# Patient Record
Sex: Female | Born: 1975
Health system: Southern US, Community
[De-identification: ages and names within clinical notes are randomized; demographics above are authoritative.]

## PROBLEM LIST (undated history)

## (undated) DIAGNOSIS — K2970 Gastritis, unspecified, without bleeding: Secondary | ICD-10-CM

## (undated) DIAGNOSIS — K259 Gastric ulcer, unspecified as acute or chronic, without hemorrhage or perforation: Secondary | ICD-10-CM

## (undated) DIAGNOSIS — K589 Irritable bowel syndrome without diarrhea: Secondary | ICD-10-CM

## (undated) DIAGNOSIS — L659 Nonscarring hair loss, unspecified: Secondary | ICD-10-CM

## (undated) DIAGNOSIS — K5790 Diverticulosis of intestine, part unspecified, without perforation or abscess without bleeding: Secondary | ICD-10-CM

## (undated) DIAGNOSIS — O039 Complete or unspecified spontaneous abortion without complication: Secondary | ICD-10-CM

## (undated) DIAGNOSIS — I10 Essential (primary) hypertension: Secondary | ICD-10-CM

## (undated) DIAGNOSIS — E559 Vitamin D deficiency, unspecified: Secondary | ICD-10-CM

## (undated) DIAGNOSIS — R51 Headache: Secondary | ICD-10-CM

## (undated) DIAGNOSIS — K635 Polyp of colon: Secondary | ICD-10-CM

## (undated) DIAGNOSIS — R519 Headache, unspecified: Secondary | ICD-10-CM

## (undated) HISTORY — DX: Headache: R51

## (undated) HISTORY — DX: Polyp of colon: K63.5

## (undated) HISTORY — PX: COLONOSCOPY: SHX174

## (undated) HISTORY — DX: Gastric ulcer, unspecified as acute or chronic, without hemorrhage or perforation: K25.9

## (undated) HISTORY — DX: Nonscarring hair loss, unspecified: L65.9

## (undated) HISTORY — DX: Diverticulosis of intestine, part unspecified, without perforation or abscess without bleeding: K57.90

## (undated) HISTORY — PX: UPPER GASTROINTESTINAL ENDOSCOPY: SHX188

## (undated) HISTORY — DX: Complete or unspecified spontaneous abortion without complication: O03.9

## (undated) HISTORY — DX: Vitamin D deficiency, unspecified: E55.9

## (undated) HISTORY — DX: Headache, unspecified: R51.9

## (undated) HISTORY — DX: Irritable bowel syndrome, unspecified: K58.9

## (undated) HISTORY — DX: Gastritis, unspecified, without bleeding: K29.70

---

## 2011-01-17 ENCOUNTER — Emergency Department: Payer: Self-pay | Admitting: Emergency Medicine

## 2011-01-17 IMAGING — US US OB < 14 WEEKS - US OB TV
1 series · 17 of 28 positions shown · non-contrast
Comparison: none

REASON FOR EXAM: vaginal bleeding, HCG 62k, eval for IUP
COMMENTS:

[Series 1: us ob < 14 weeks - us ob tv · 17 of 53 slices shown]
[im 1/53]
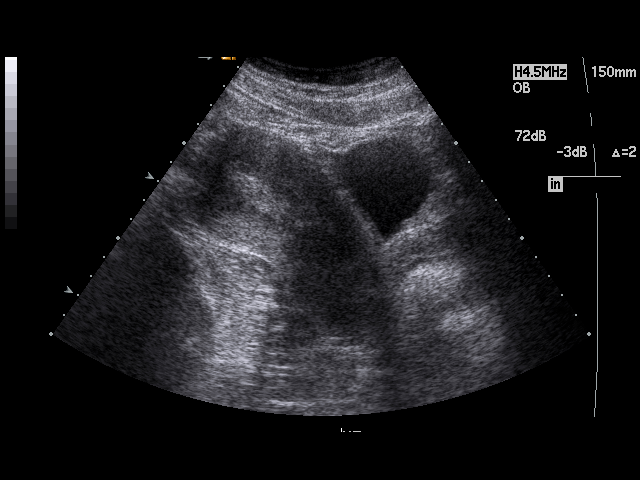
[im 4/53]
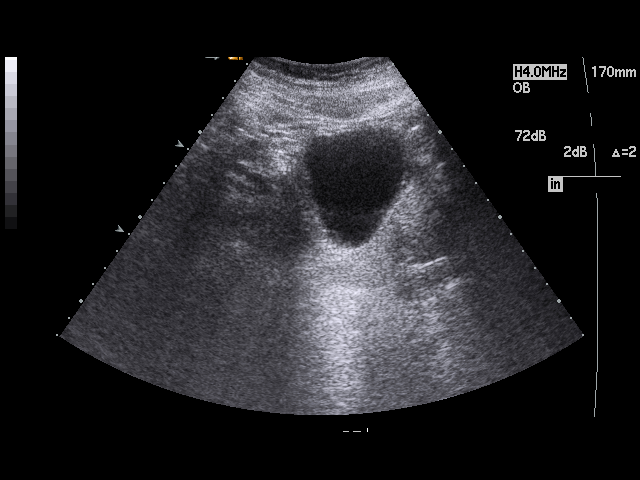
[im 8/53]
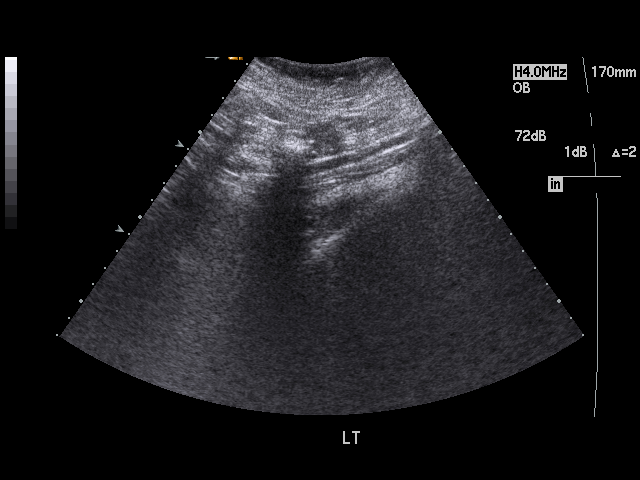
[im 10/53]
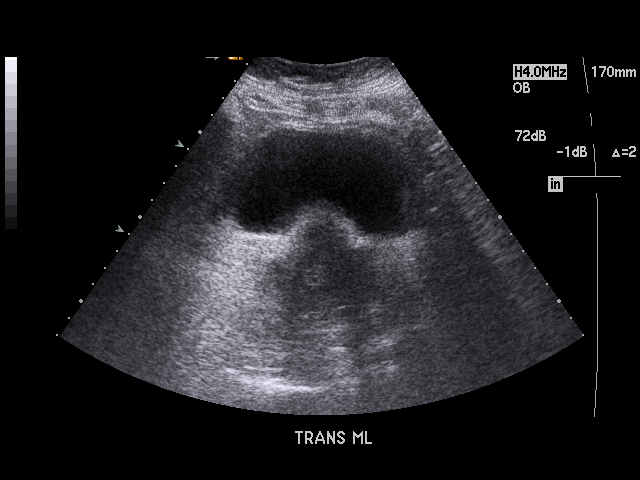
[im 14/53]
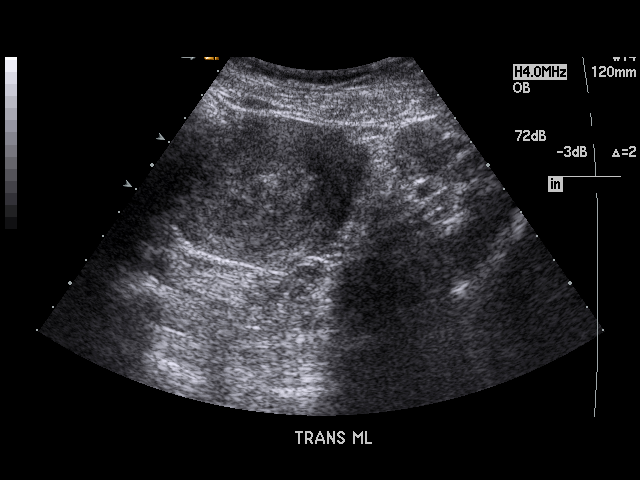
[im 18/53]
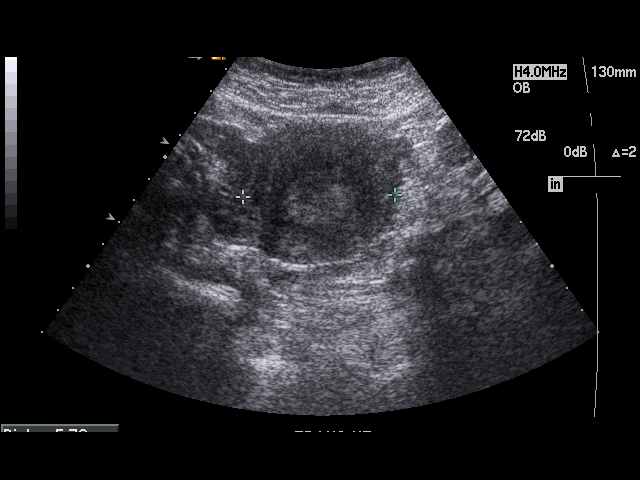
[im 20/53]
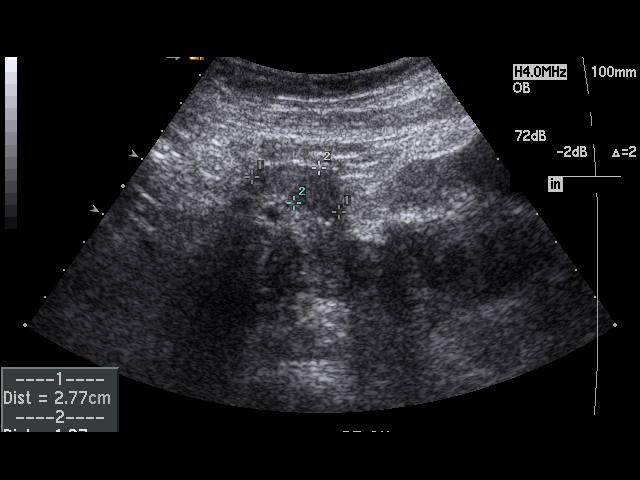
[im 24/53]
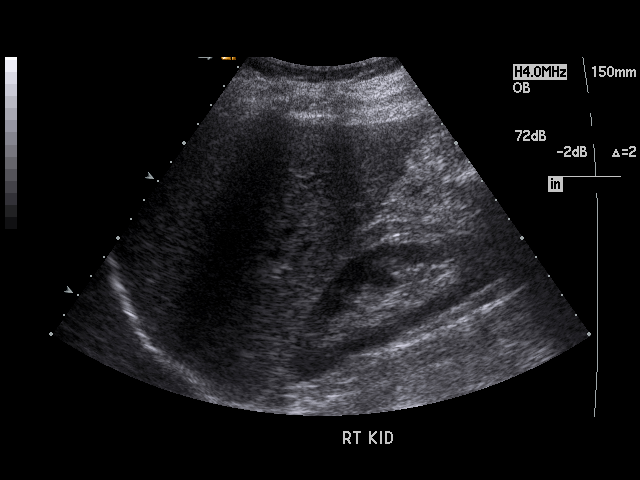
[im 27/53]
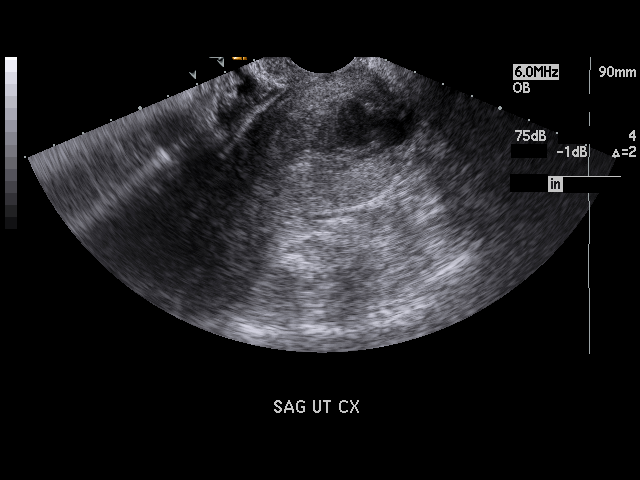
[im 29/53]
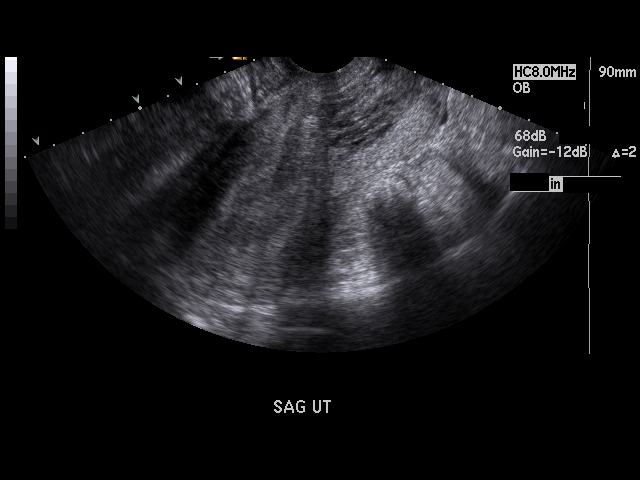
[im 33/53]
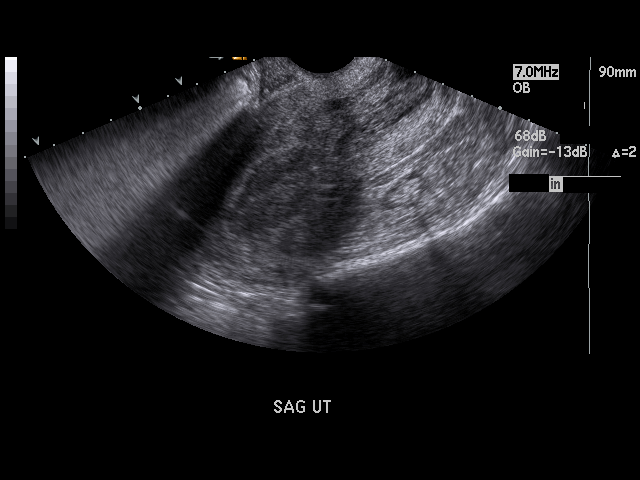
[im 35/53]
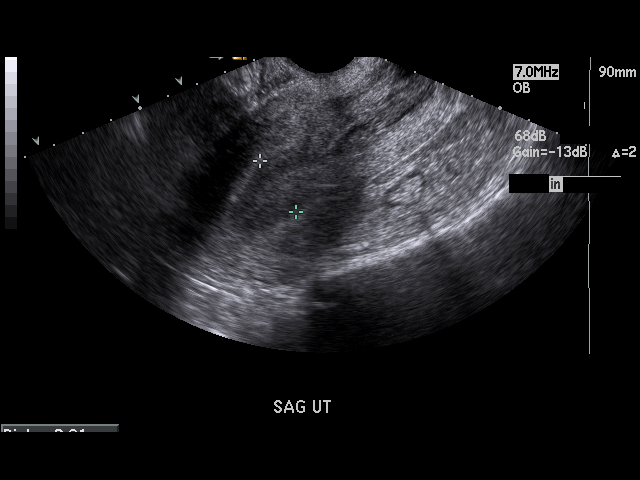
[im 39/53]
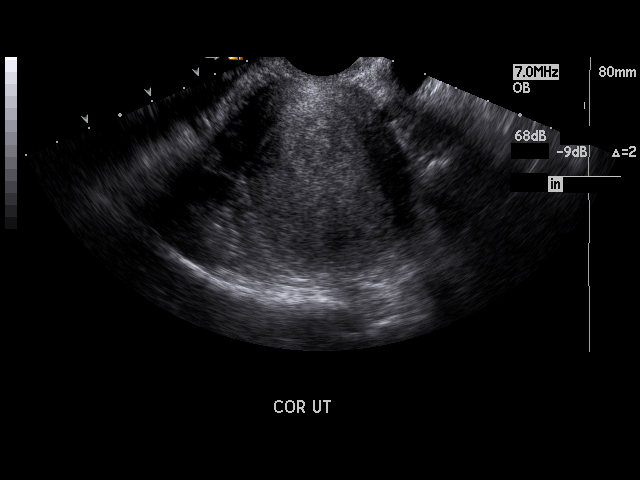
[im 43/53]
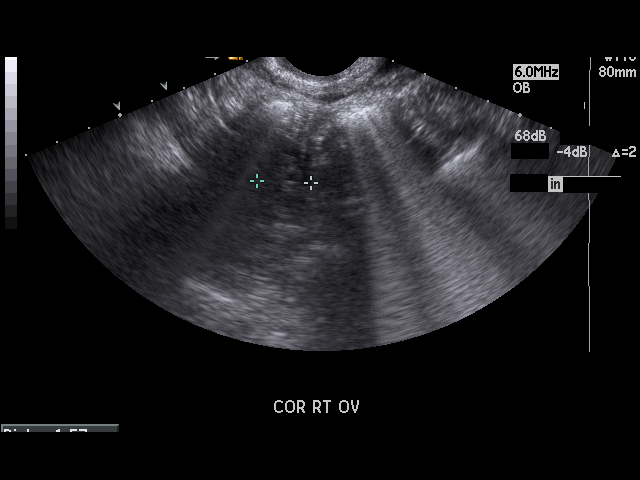
[im 45/53]
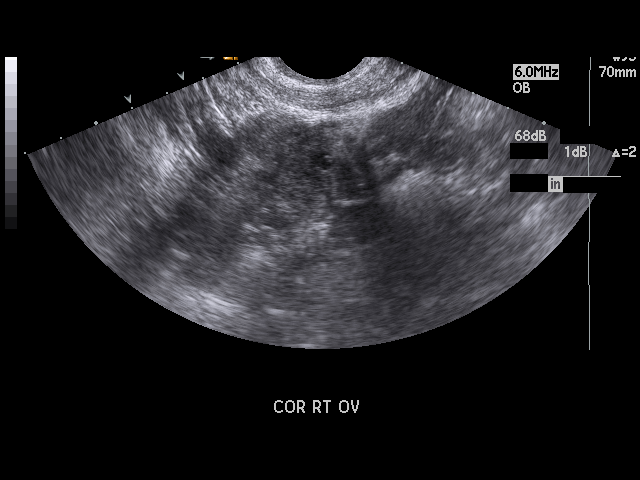
[im 49/53]
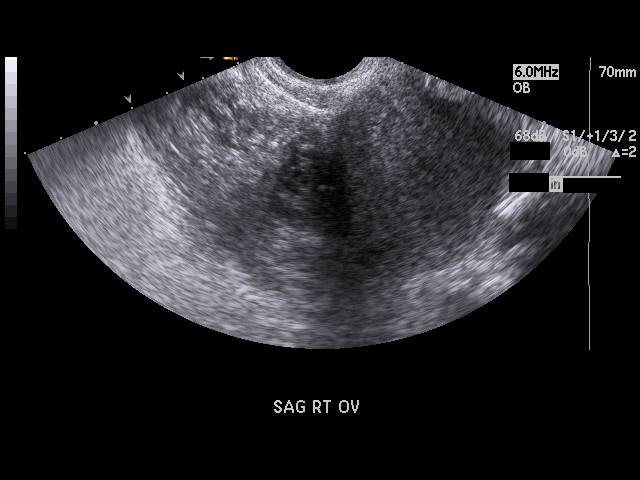
[im 53/53]
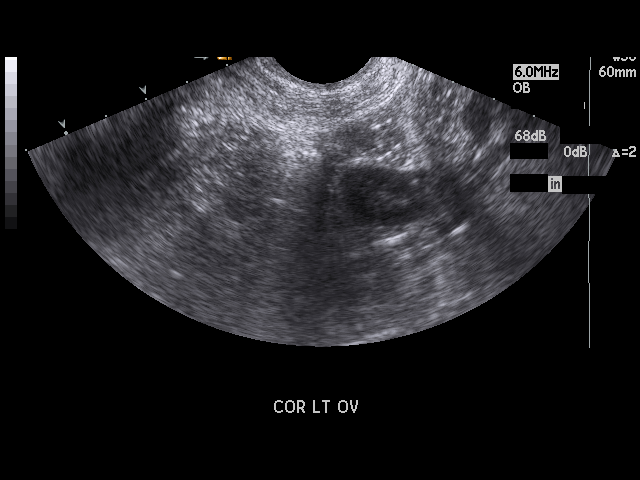

[17 of 28 positions shown; findings below may reference images not displayed]

PROCEDURE:     US  - US OB LESS THAN 14 WEEKS/W TRANS  - [DATE]  [DATE]

RESULT:     Comparison: None.

Technique and Findings:
Multiple grayscale images were obtained of the pelvis via transabdominal and
endovaginal ultrasound.

The endometrial stripe is mildly thickened, measuring 20 mm. No gestational
sac identified. Some heterogeneous echogenicity is seen near the cervical
canal.

The right ovary measures 2.8 x 1.6 x 1.6 cm. The left ovary measures 2.3 x
2.1 x 1.3 cm. The ovaries are normal in grayscale appearance. Spectral or
color Doppler images were not performed of the ovaries. No adnexal mass
identified.
IMPRESSION: No gestational sac identified. Given the positive beta HCG, differential
includes early intrauterine pregnancy, failed intrauterine pregnancy, and
ectopic pregnancy.  Recommend close clinical followup, serial quantitative
beta HCGs, and followup ultrasound as clinically indicated.

## 2012-08-25 ENCOUNTER — Emergency Department: Payer: Self-pay | Admitting: Unknown Physician Specialty

## 2012-08-25 LAB — CBC
HCT: 32.9 % — ABNORMAL LOW (ref 35.0–47.0)
MCH: 31.4 pg (ref 26.0–34.0)
MCHC: 33 g/dL (ref 32.0–36.0)
MCV: 95 fL (ref 80–100)
Platelet: 347 10*3/uL (ref 150–440)
RBC: 3.45 10*6/uL — ABNORMAL LOW (ref 3.80–5.20)
RDW: 13 % (ref 11.5–14.5)
WBC: 18.6 10*3/uL — ABNORMAL HIGH (ref 3.6–11.0)

## 2012-08-25 LAB — URINALYSIS, COMPLETE
Glucose,UR: NEGATIVE mg/dL (ref 0–75)
Leukocyte Esterase: NEGATIVE
Nitrite: NEGATIVE
Specific Gravity: 1.026 (ref 1.003–1.030)
Squamous Epithelial: 1
WBC UR: 14 /HPF (ref 0–5)

## 2012-08-25 LAB — COMPREHENSIVE METABOLIC PANEL
Anion Gap: 7 (ref 7–16)
BUN: 10 mg/dL (ref 7–18)
Bilirubin,Total: 0.4 mg/dL (ref 0.2–1.0)
Calcium, Total: 9.1 mg/dL (ref 8.5–10.1)
Chloride: 105 mmol/L (ref 98–107)
Co2: 24 mmol/L (ref 21–32)
EGFR (African American): >60
EGFR (Non-African Amer.): >60
Glucose: 132 mg/dL — ABNORMAL HIGH (ref 65–99)
Potassium: 3.4 mmol/L — ABNORMAL LOW (ref 3.5–5.1)
SGOT(AST): 20 U/L (ref 15–37)
SGPT (ALT): 21 U/L (ref 12–78)
Total Protein: 7.1 g/dL (ref 6.4–8.2)

## 2012-08-25 LAB — HCG, QUANTITATIVE, PREGNANCY: Beta Hcg, Quant.: 991 m[IU]/mL — ABNORMAL HIGH

## 2012-08-25 IMAGING — US US OB < 14 WEEKS - US OB TV
1 series · 14 of 28 positions shown · non-contrast
Comparison: none

REASON FOR EXAM: Abdominal pain, vaginal bleeding
COMMENTS:   May transport without cardiac monitor

[Series 1: us ob < 14 weeks - us ob tv · 0.25mm/px · 14 of 106 slices shown]
[im 4/106]
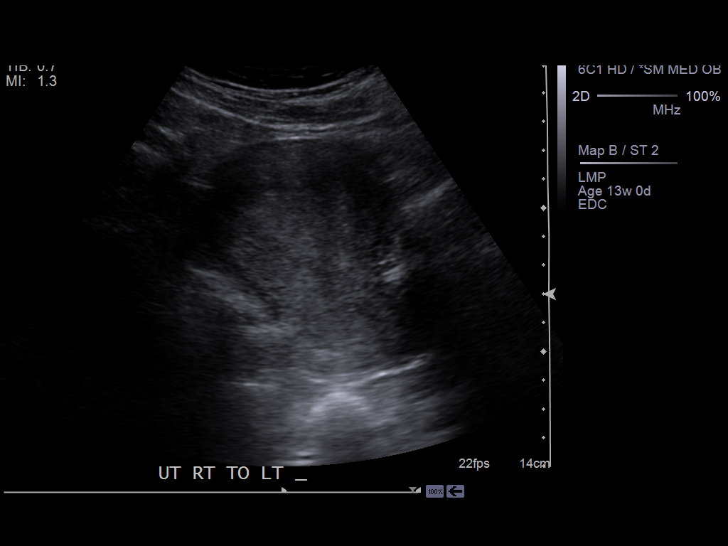
[im 12/106]
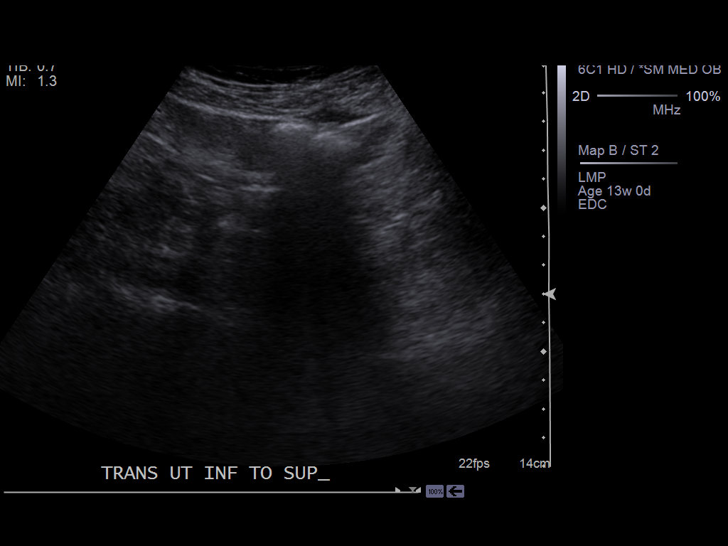
[im 20/106]
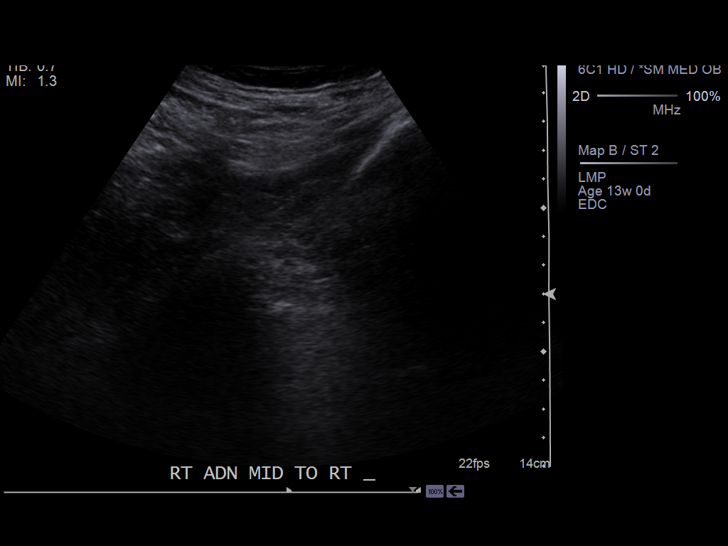
[im 28/106]
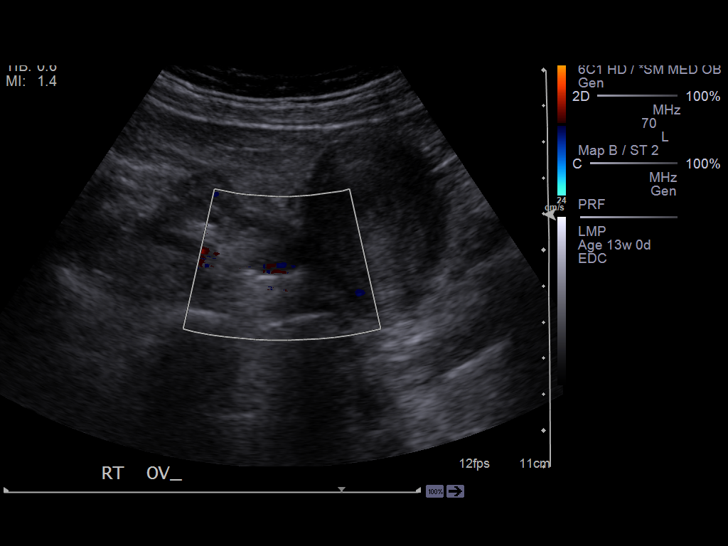
[im 36/106]
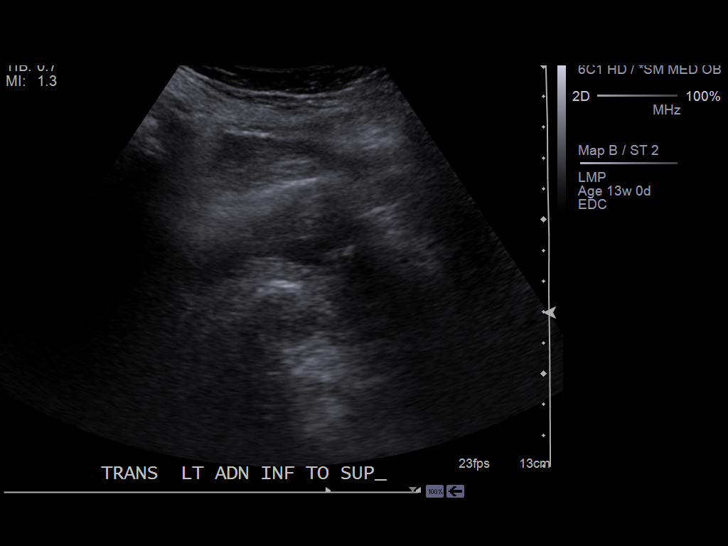
[im 43/106]
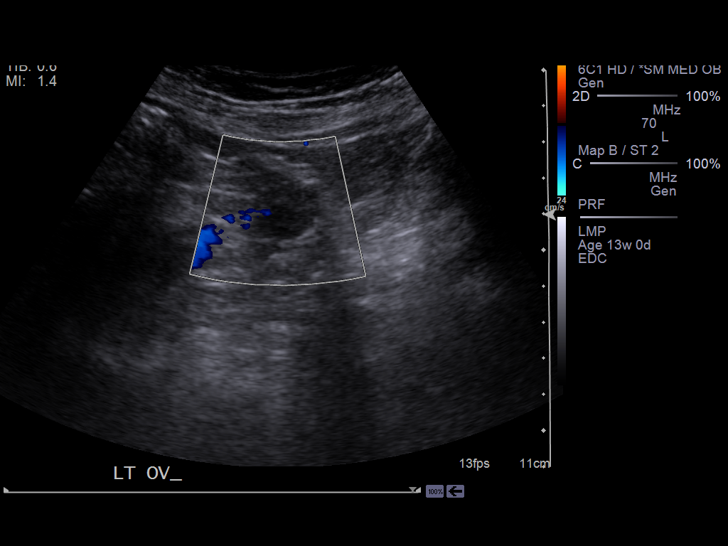
[im 51/106]
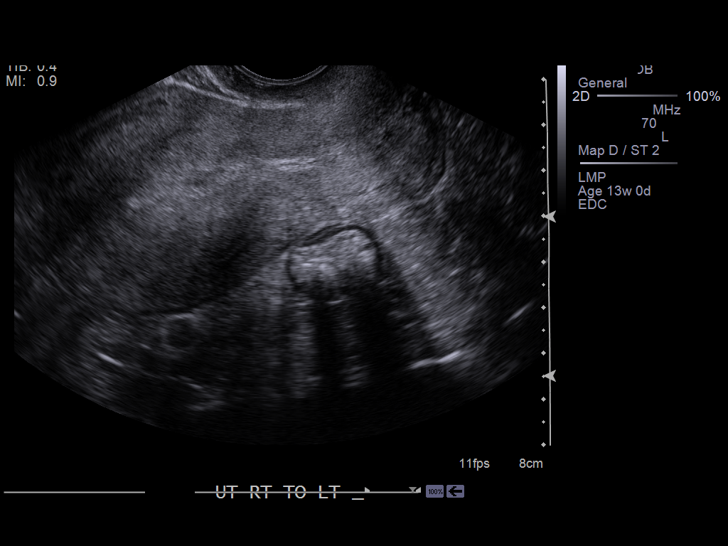
[im 59/106]
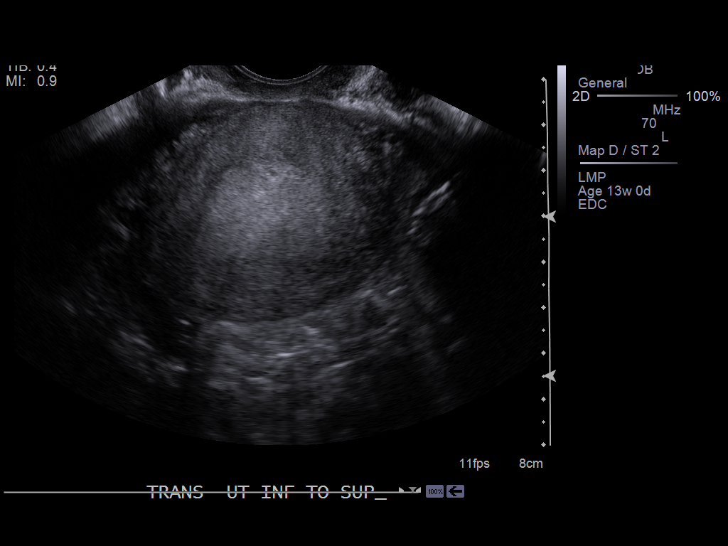
[im 67/106]
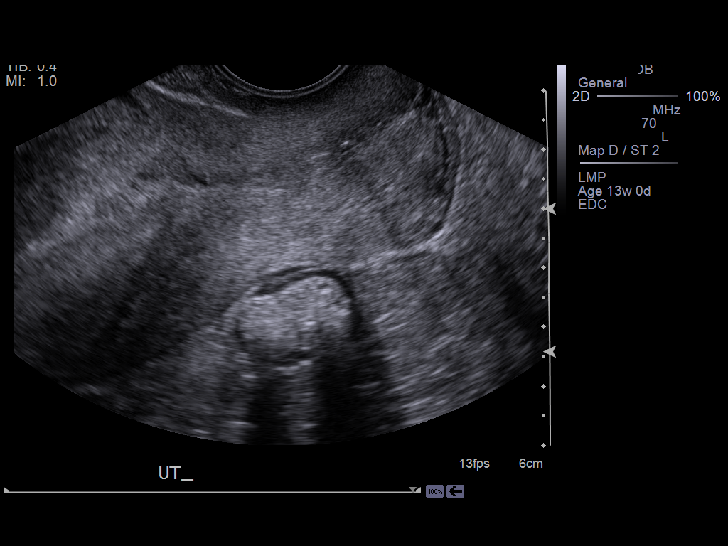
[im 74/106]
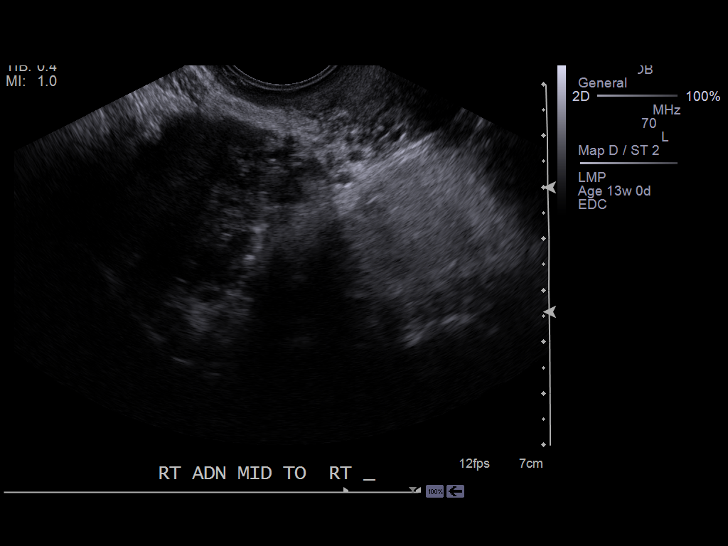
[im 82/106]
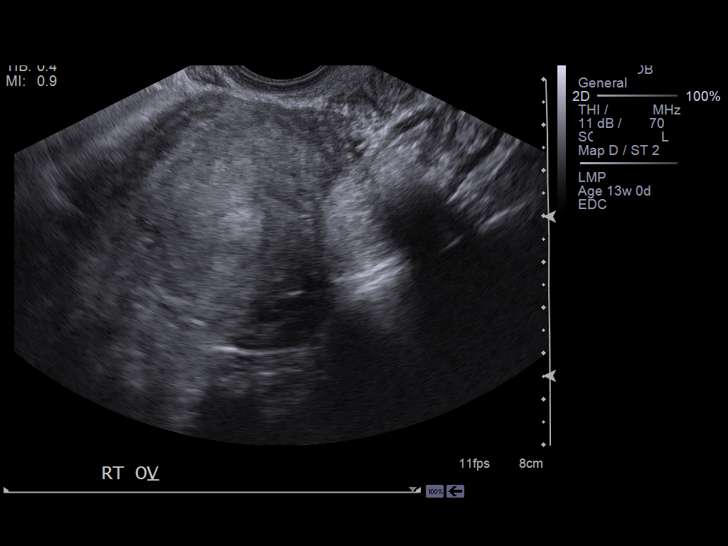
[im 90/106]
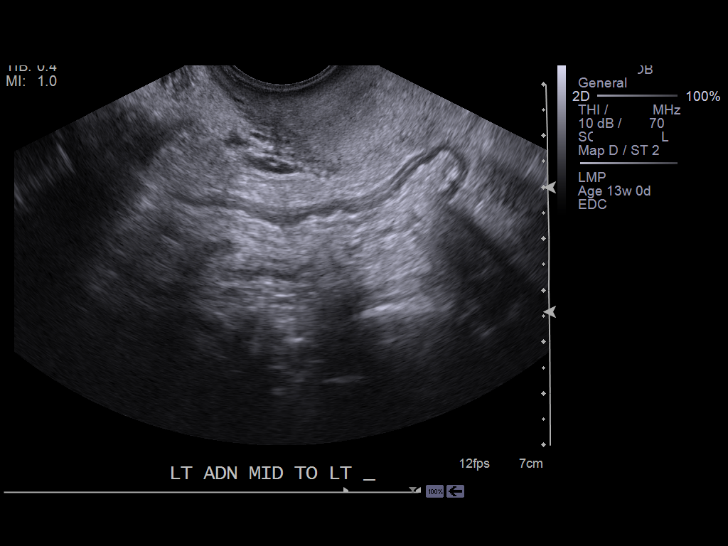
[im 98/106]
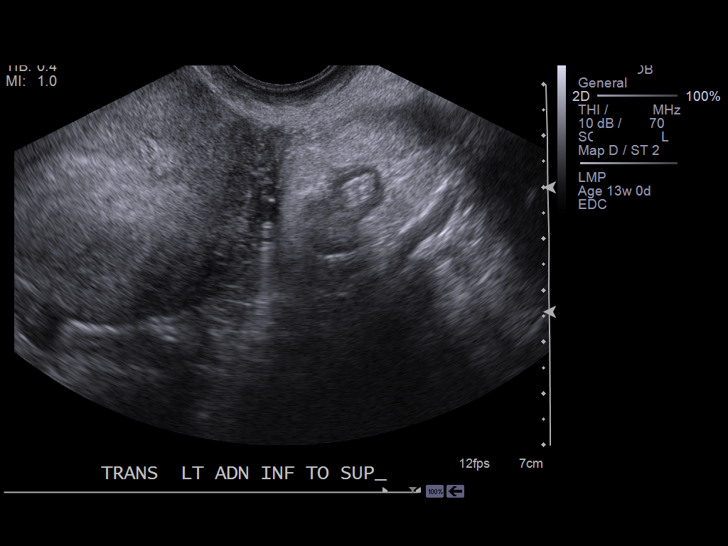
[im 106/106]
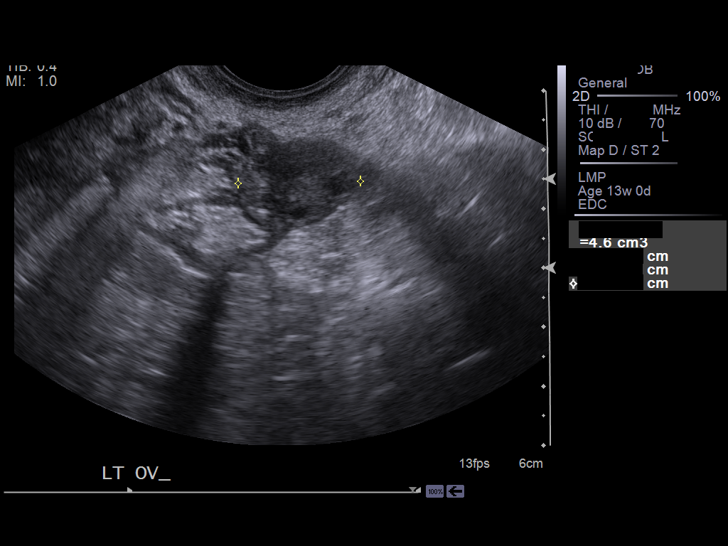

[14 of 28 positions shown; findings below may reference images not displayed]

PROCEDURE:     US  - US OB LESS THAN 14 WEEKS/W TRANS  - [DATE] [DATE]

RESULT:     The uterus appears empty. It measures 9.1 x 5.7 by 6.2 cm. The
endometrial stripe is thickened at 14.3 mm. No gestational sac or yolk sac
or fetal pole is demonstrated.

The maternal ovaries are normal in appearance with the right ovary measuring
2.6 x 1.3 x 1.3 cm. The left ovary measures 2.7 x 1.6 x 2.1 cm.
IMPRESSION: 1. The findings likely reflect recent miscarriage. There is no evidence of
an IUP nor significant retained products of conception. The endometrial
stripe is thickened however.
2. No adnexal abnormalities are demonstrated. There is no free fluid in the
pelvis.

Serial followup beta-hCG determinations as well as followup pelvic
ultrasound exams would be useful.

[REDACTED]

## 2012-09-06 HISTORY — PX: OTHER SURGICAL HISTORY: SHX169

## 2013-05-22 ENCOUNTER — Ambulatory Visit: Payer: Self-pay

## 2014-02-04 ENCOUNTER — Encounter (INDEPENDENT_AMBULATORY_CARE_PROVIDER_SITE_OTHER): Payer: Self-pay

## 2014-02-04 ENCOUNTER — Encounter: Payer: Self-pay | Admitting: Internal Medicine

## 2014-02-04 ENCOUNTER — Ambulatory Visit (INDEPENDENT_AMBULATORY_CARE_PROVIDER_SITE_OTHER): Payer: BC Managed Care – PPO | Admitting: Internal Medicine

## 2014-02-04 VITALS — BP 138/84 | HR 107 | Temp 98.3°F | Ht 60.0 in | Wt 128.5 lb

## 2014-02-04 DIAGNOSIS — F439 Reaction to severe stress, unspecified: Secondary | ICD-10-CM

## 2014-02-04 DIAGNOSIS — O039 Complete or unspecified spontaneous abortion without complication: Secondary | ICD-10-CM

## 2014-02-04 DIAGNOSIS — E559 Vitamin D deficiency, unspecified: Secondary | ICD-10-CM

## 2014-02-04 DIAGNOSIS — R51 Headache: Secondary | ICD-10-CM

## 2014-02-04 DIAGNOSIS — L659 Nonscarring hair loss, unspecified: Secondary | ICD-10-CM

## 2014-02-04 DIAGNOSIS — R519 Headache, unspecified: Secondary | ICD-10-CM

## 2014-02-04 DIAGNOSIS — Z733 Stress, not elsewhere classified: Secondary | ICD-10-CM

## 2014-02-04 NOTE — Progress Notes (Signed)
Pre visit review using our clinic review tool, if applicable. No additional management support is needed unless otherwise documented below in the visit note. 

## 2014-02-05 ENCOUNTER — Encounter: Payer: Self-pay | Admitting: Internal Medicine

## 2014-02-05 DIAGNOSIS — R519 Headache, unspecified: Secondary | ICD-10-CM | POA: Insufficient documentation

## 2014-02-05 DIAGNOSIS — O039 Complete or unspecified spontaneous abortion without complication: Secondary | ICD-10-CM | POA: Insufficient documentation

## 2014-02-05 DIAGNOSIS — L659 Nonscarring hair loss, unspecified: Secondary | ICD-10-CM | POA: Insufficient documentation

## 2014-02-05 DIAGNOSIS — F439 Reaction to severe stress, unspecified: Secondary | ICD-10-CM | POA: Insufficient documentation

## 2014-02-05 DIAGNOSIS — R51 Headache: Secondary | ICD-10-CM

## 2014-02-05 DIAGNOSIS — E559 Vitamin D deficiency, unspecified: Secondary | ICD-10-CM | POA: Insufficient documentation

## 2014-02-05 NOTE — Assessment & Plan Note (Signed)
Worsening pain.  Unclear etiology.  Question of more nerve related.  Does not appear to be an actual "headache".  No rash.  Does have some localized point tenderness, but she also describes a pressure/squeezing sensation.  No vision change.  No other neurological changes.  Will have neurology evaluate to help with diagnosis.  Question if would benefit form a trial of gabapentin.  She was in agreement with referral.

## 2014-02-05 NOTE — Assessment & Plan Note (Signed)
On vitamin D supplements.  Follow.   

## 2014-02-05 NOTE — Assessment & Plan Note (Signed)
Feels she is handling things relatively well.  No increased stress.  Follow.

## 2014-02-05 NOTE — Assessment & Plan Note (Signed)
Persistent.  Seeing dermatology.  Reports has had recent normal hgb and tsh.  Is taking vitamin D supplements.  Unclear etiology.  Just started rogaine.  Obtain labs and records.

## 2014-02-05 NOTE — Progress Notes (Signed)
Subjective:    Patient ID: Michele Kim, female    DOB: Aug 16, 1976, 38 y.o.   MRN: [id number]  HPI 38 year old female with past history of a miscarriage (one year ago) and hair loss.  She comes in today to follow up on these issues as well as to establish care.  She has been followed at [facility].  Last pelvic and pap 5/14.  States has never had an abnormal pap smear.  She states her periods are regular.  Occur every 4-5 weeks.  Not using any form of birth control.  Not sure if she wants more children.  Has one 68 year old daughter.  States that over the last two months has developed significant hair loss.  She is seeing dermatology (in [place]).  She is currently on clindamycin, clobetasol and rogaine.  Just started the rogaine two weeks ago.  States her paternal grandmother and paternal aunt had hair loss later in life.  Some increased stress, but states this is nothing out of the ordinary.  She had recent labs through dermatology.  Vitamin D low.  Being replaced.  Hgb and thyroid - wnl (per her report).  She does report a squeezing sensation/pain over the top of her head extending down the right posterior lateral side.  Pain started with the hair loss.  Has worsened recently.  No actual headache but does report the increased pressure/squeezing pain.  Some of the pain is reproducible in certain areas of her scalp.  No rash.  Became teary eyed discussing the increased pain.  Worse at times.  No vision change.  Eating and drinking well.  No nausea or vomiting.  No bowel change.     Past Medical History  Diagnosis Date  . Frequent headaches     H/O  . Miscarriage     x 2  . Hair loss   . Vitamin D deficiency     Outpatient Encounter Prescriptions as of 02/04/2014  Medication Sig  . clindamycin (CLEOCIN T) 1 % external solution Apply 1 application topically daily.  . clobetasol (TEMOVATE) 0.05 % external solution Apply 1 application topically daily.  . ergocalciferol (VITAMIN  D2) 50000 UNITS capsule Take 50,000 Units by mouth once a week.    Review of Systems Reports the head pain as outlined.  No rash.  No significant lightheadedness or dizziness.  No increased sinus or allergy symptoms.  No chest pain, tightness or palpitations.  No increased shortness of breath, cough or congestion.  No acid reflux.  No nausea or vomiting.  No abdominal pain or cramping.  No bowel change, such as diarrhea, constipation, BRBPR or melana.  No urine change.  Miscarriage last year.  Periods regular.  Hair loss as outlined.       Objective:   Physical Exam Filed Vitals:   02/04/14 1346  BP: 138/84  Pulse: 107  Temp: 98.3 F (36.8 C)   Blood pressure recheck:  110-112/80, pulse 70  38 year old female in no acute distress.   HEENT:  Nares- clear.  Oropharynx - without lesions.  Scalp- no rash.  Increased pain to palpation over certain localized areas of the scalp.  Increased hair loss - more localized top center of head and extending posterior laterally.   NECK:  Supple.  Nontender.  No audible bruit.  HEART:  Appears to be regular. LUNGS:  No crackles or wheezing audible.  Respirations even and unlabored.  RADIAL PULSE:  Equal bilaterally.     ABDOMEN:  Soft, nontender.  Bowel sounds present and normal.  No audible abdominal bruit.   EXTREMITIES:  No increased edema present.  DP pulses palpable and equal bilaterally.          Assessment & Plan:  HEALTH MAINTENANCE.  Schedule her for a physical next visit.  Obtain records from [facility] and dermatolog. Schedule baseline mammogram.    I spent 45 minutes with the patient and more than 50% of the time was spent in consultation regarding the above.

## 2014-02-05 NOTE — Assessment & Plan Note (Signed)
Had a miscarriage one year ago.  Regular periods now.  Followed up with gyn after miscarriage.  Not sure if she wants more children.  Discussed birth control options.    

## 2014-04-30 ENCOUNTER — Encounter: Payer: Self-pay | Admitting: Internal Medicine

## 2014-04-30 ENCOUNTER — Ambulatory Visit (INDEPENDENT_AMBULATORY_CARE_PROVIDER_SITE_OTHER): Payer: BC Managed Care – PPO | Admitting: Internal Medicine

## 2014-04-30 VITALS — BP 112/76 | HR 105 | Temp 98.2°F | Resp 12 | Ht 60.0 in | Wt 120.5 lb

## 2014-04-30 DIAGNOSIS — L659 Nonscarring hair loss, unspecified: Secondary | ICD-10-CM

## 2014-04-30 DIAGNOSIS — Z733 Stress, not elsewhere classified: Secondary | ICD-10-CM

## 2014-04-30 DIAGNOSIS — R51 Headache: Secondary | ICD-10-CM

## 2014-04-30 DIAGNOSIS — Z1239 Encounter for other screening for malignant neoplasm of breast: Secondary | ICD-10-CM

## 2014-04-30 DIAGNOSIS — F439 Reaction to severe stress, unspecified: Secondary | ICD-10-CM

## 2014-04-30 DIAGNOSIS — E559 Vitamin D deficiency, unspecified: Secondary | ICD-10-CM

## 2014-04-30 DIAGNOSIS — O039 Complete or unspecified spontaneous abortion without complication: Secondary | ICD-10-CM

## 2014-04-30 DIAGNOSIS — R519 Headache, unspecified: Secondary | ICD-10-CM

## 2014-04-30 LAB — COMPREHENSIVE METABOLIC PANEL
ALT: 16 U/L (ref 0–35)
AST: 19 U/L (ref 0–37)
Albumin: 4.2 g/dL (ref 3.5–5.2)
Alkaline Phosphatase: 54 U/L (ref 39–117)
BILIRUBIN TOTAL: 0.4 mg/dL (ref 0.2–1.2)
BUN: 14 mg/dL (ref 6–23)
CHLORIDE: 104 meq/L (ref 96–112)
CO2: 24 meq/L (ref 19–32)
CREATININE: 0.7 mg/dL (ref 0.4–1.2)
Calcium: 9.4 mg/dL (ref 8.4–10.5)
GFR: 94.29 mL/min (ref >60.00)
GLUCOSE: 82 mg/dL (ref 70–99)
Potassium: 4.7 mEq/L (ref 3.5–5.1)
SODIUM: 138 meq/L (ref 135–145)
Total Protein: 6.9 g/dL (ref 6.0–8.3)

## 2014-04-30 LAB — TSH: TSH: 0.58 u[IU]/mL (ref 0.35–4.50)

## 2014-04-30 LAB — CBC WITH DIFFERENTIAL/PLATELET
Basophils Absolute: 0.1 10*3/uL (ref 0.0–0.1)
Basophils Relative: 1.5 % (ref 0.0–3.0)
Eosinophils Absolute: 0.8 10*3/uL — ABNORMAL HIGH (ref 0.0–0.7)
Eosinophils Relative: 9.7 % — ABNORMAL HIGH (ref 0.0–5.0)
HEMATOCRIT: 42.9 % (ref 36.0–46.0)
Hemoglobin: 14.3 g/dL (ref 12.0–15.0)
LYMPHS ABS: 2.3 10*3/uL (ref 0.7–4.0)
LYMPHS PCT: 29.7 % (ref 12.0–46.0)
MCHC: 33.3 g/dL (ref 30.0–36.0)
MCV: 95.9 fl (ref 78.0–100.0)
MONOS PCT: 8.1 % (ref 3.0–12.0)
Monocytes Absolute: 0.6 10*3/uL (ref 0.1–1.0)
Neutro Abs: 4 10*3/uL (ref 1.4–7.7)
Neutrophils Relative %: 51 % (ref 43.0–77.0)
Platelets: 275 10*3/uL (ref 150.0–400.0)
RBC: 4.47 Mil/uL (ref 3.87–5.11)
RDW: 13.4 % (ref 11.5–15.5)
WBC: 7.9 10*3/uL (ref 4.0–10.5)

## 2014-04-30 NOTE — Progress Notes (Signed)
Pre visit review using our clinic review tool, if applicable. No additional management support is needed unless otherwise documented below in the visit note. 

## 2014-05-01 ENCOUNTER — Encounter: Payer: Self-pay | Admitting: *Deleted

## 2014-05-02 ENCOUNTER — Encounter: Payer: Self-pay | Admitting: Internal Medicine

## 2014-05-02 NOTE — Progress Notes (Signed)
Subjective:    Patient ID: Michele Kim, female    DOB: 30-May-1976, 38 y.o.   MRN: [id number]  HPI 38 year old female with past history of a miscarriage (one year ago) and hair loss.  She comes in today to follow up on these issues as well as for a complete physical exam.   She has previously been followed at [facility].  Last pelvic and pap 5/14.  States has never had an abnormal pap smear.  She states her periods are regular.  Occur every 4-5 weeks.  Not using any form of birth control.  Not sure if she wants more children.  Has one 62 year old daughter.  We discussed this again today.  States she is "pretty sure she does not want more children".  Having her period today.  Over the last few months has developed significant hair loss.  She is seeing dermatology (in [place]).  She has tried multiple medications.  States her paternal grandmother and paternal aunt had hair loss later in life.  Some increased stress, but states this is nothing out of the ordinary.  She had recent labs through dermatology.  Vitamin D low.  Being replaced.  Hgb and thyroid - wnl (per her report).  She is planning to see Dr Evorn Gong in 10/15.  She does report a squeezing sensation/pain over the top of her head extending down the right posterior lateral side.  Pain started with the hair loss.  Has worsened recently.  Describes it as a pressure sensation - like something squeezing her head.  No vision change.  No increase in pain with bending forward or coughing.   No rash.  Became teary eyed discussing the increased pain.  Worse at times.   Eating and drinking well.  No nausea or vomiting, except for occasional nausea with increased pain.  No bowel change.     Past Medical History  Diagnosis Date  . Frequent headaches     H/O  . Miscarriage     x 2  . Hair loss   . Vitamin D deficiency     Outpatient Encounter Prescriptions as of 04/30/2014  Medication Sig  . clindamycin (CLEOCIN T) 1 % external  solution Apply 1 application topically daily.  . clobetasol (TEMOVATE) 0.05 % external solution Apply 1 application topically daily.  . ergocalciferol (VITAMIN D2) 50000 UNITS capsule Take 50,000 Units by mouth once a week.    Review of Systems Reports the head pain as outlined.  Described as a squeezing/pressure sensation.  No rash.  No significant lightheadedness or dizziness.  No increased sinus or allergy symptoms.  No vision change.  No chest pain, tightness or palpitations.  No increased shortness of breath, cough or congestion.  No acid reflux.  No vomiting.  Occasional nausea as outlined.  No abdominal pain or cramping.  No bowel change, such as diarrhea, constipation, BRBPR or melana.  No urine change.  Miscarriage last year.  Periods regular.  Hair loss as outlined.  Takes BCs.  This woks best for her.  Make take 2-3 per day.       Objective:   Physical Exam  Filed Vitals:   04/30/14 1133  BP: 112/76  Pulse: 105  Temp: 98.2 F (36.8 C)  Resp: 19   38 year old female in no acute distress.   HEENT:  Nares- clear.  Oropharynx - without lesions.  Scalp- no rash.   Increased hair loss - more localized top center of head  and extending posterior laterally.  Unable to visualize fundi fully.   NECK:  Supple.  Nontender.  No audible bruit.  HEART:  Appears to be regular. LUNGS:  No crackles or wheezing audible.  Respirations even and unlabored.  RADIAL PULSE:  Equal bilaterally.    BREASTS:  No nipple discharge or nipple retraction present.  Could not appreciate any distinct nodules or axillary adenopathy.  ABDOMEN:  Soft, nontender.  Bowel sounds present and normal.  No audible abdominal bruit.  GU:  Not performed.  Pt having her period.     EXTREMITIES:  No increased edema present.  DP pulses palpable and equal bilaterally.      Assessment & Plan:  HEALTH MAINTENANCE.  Physical today.  Unable to perform pelvic/pap.  Having her period.   Schedule baseline mammogram.    I spent 25  minutes with the patient and more than 50% of the time was spent in consultation regarding the above.

## 2014-05-02 NOTE — Assessment & Plan Note (Signed)
Persistent.  Seeing dermatology.  Reports has had recent normal hgb and tsh.  Is taking vitamin D supplements.  Unclear etiology.  Has tried rogaine.  She is scheduled to see Dr Evorn Gong in 10/15.  States she scheduled this appt to get another opinion.

## 2014-05-02 NOTE — Assessment & Plan Note (Signed)
Worsening pain.  Unclear etiology.   No rash.   No vision change.  No other neurological changes.  Will have neurology evaluate to help with diagnosis.  Question if would benefit form a trial of gabapentin.  She was in agreement with referral.  Discussed with neurology.  They will try to work her in soon.  Discussed MRI, especially given the increased pain and pressure/squeezing feeling.  She wants to check with her insurance before scheduling.  Discussed with her regarding decreasing the amount of BCs.  Discussed rebound headaches.  Start magnesium.  Refer to neurology.

## 2014-05-02 NOTE — Assessment & Plan Note (Signed)
On vitamin D supplements.  Follow.   

## 2014-05-02 NOTE — Assessment & Plan Note (Signed)
Feels she is handling things relatively well.  Follow.   

## 2014-05-02 NOTE — Assessment & Plan Note (Signed)
Had a miscarriage one year ago.  Regular periods now.  Followed up with gyn after miscarriage.  Not sure if she wants more children.  Discussed birth control options.

## 2014-06-13 ENCOUNTER — Encounter: Payer: BC Managed Care – PPO | Admitting: Internal Medicine

## 2014-07-19 ENCOUNTER — Other Ambulatory Visit (HOSPITAL_COMMUNITY)
Admission: RE | Admit: 2014-07-19 | Discharge: 2014-07-19 | Disposition: A | Discharge status: Home / Self Care | Payer: BC Managed Care – PPO | Source: Ambulatory Visit | Attending: Internal Medicine | Admitting: Internal Medicine

## 2014-07-19 ENCOUNTER — Ambulatory Visit (INDEPENDENT_AMBULATORY_CARE_PROVIDER_SITE_OTHER): Payer: BC Managed Care – PPO | Admitting: Internal Medicine

## 2014-07-19 ENCOUNTER — Encounter: Payer: Self-pay | Admitting: Internal Medicine

## 2014-07-19 ENCOUNTER — Ambulatory Visit (INDEPENDENT_AMBULATORY_CARE_PROVIDER_SITE_OTHER): Payer: BC Managed Care – PPO | Admitting: *Deleted

## 2014-07-19 VITALS — BP 101/67 | HR 96 | Temp 98.3°F | Ht 60.5 in | Wt 125.8 lb

## 2014-07-19 DIAGNOSIS — Z124 Encounter for screening for malignant neoplasm of cervix: Secondary | ICD-10-CM

## 2014-07-19 DIAGNOSIS — Z1151 Encounter for screening for human papillomavirus (HPV): Secondary | ICD-10-CM | POA: Insufficient documentation

## 2014-07-19 DIAGNOSIS — R87810 Cervical high risk human papillomavirus (HPV) DNA test positive: Secondary | ICD-10-CM | POA: Insufficient documentation

## 2014-07-19 DIAGNOSIS — Z23 Encounter for immunization: Secondary | ICD-10-CM

## 2014-07-19 DIAGNOSIS — R519 Headache, unspecified: Secondary | ICD-10-CM

## 2014-07-19 DIAGNOSIS — Z01419 Encounter for gynecological examination (general) (routine) without abnormal findings: Secondary | ICD-10-CM | POA: Diagnosis not present

## 2014-07-19 DIAGNOSIS — Z658 Other specified problems related to psychosocial circumstances: Secondary | ICD-10-CM

## 2014-07-19 DIAGNOSIS — L659 Nonscarring hair loss, unspecified: Secondary | ICD-10-CM

## 2014-07-19 DIAGNOSIS — Z13220 Encounter for screening for lipoid disorders: Secondary | ICD-10-CM

## 2014-07-19 DIAGNOSIS — R51 Headache: Secondary | ICD-10-CM

## 2014-07-19 DIAGNOSIS — F439 Reaction to severe stress, unspecified: Secondary | ICD-10-CM

## 2014-07-19 NOTE — Progress Notes (Signed)
Pre visit review using our clinic review tool, if applicable. No additional management support is needed unless otherwise documented below in the visit note. 

## 2014-07-21 ENCOUNTER — Encounter: Payer: Self-pay | Admitting: Internal Medicine

## 2014-07-21 NOTE — Progress Notes (Signed)
Subjective:    Patient ID: Michele Kim, female    DOB: 12-14-1975, 38 y.o.   MRN: [id number]  HPI 38 year old female with past history of a miscarriage (one year ago) and hair loss.  She comes in today to follow up on these issues as well as for her pap smear.   She has previously been [facility] at [facility].  Last pelvic and pap 5/14.  States has never had an abnormal pap smear.  She states her periods are regular.  Occur every 4-5 weeks.  LMP 06/28/14.  Over the last few months had developed significant hair loss.  She is seeing dermatology.  Now seeing Dr Evorn Gong.  He is treating her now.  Hair is growing back.  She also reported a squeezing sensation/pain over the top of her head extending down the right posterior lateral side.  Pain started with the hair loss.  Had worsened recently.  Described it as a pressure sensation - like something squeezing her head.  Was referred to Dr Manuella Ghazi.  Started on nortriptyline and robaxin.  Pain is better.  Trying to cut back on BCs.  Overall feels better and doing better.       Past Medical History  Diagnosis Date  . Frequent headaches     H/O  . Miscarriage     x 2  . Hair loss   . Vitamin D deficiency     Outpatient Encounter Prescriptions as of 07/19/2014  Medication Sig  . clindamycin (CLEOCIN T) 1 % external solution Apply 1 application topically daily.  . clobetasol (TEMOVATE) 0.05 % external solution Apply 1 application topically daily.  . methocarbamol (ROBAXIN) 750 MG tablet Take 750 mg by mouth 2 (two) times daily.   . nortriptyline (PAMELOR) 25 MG capsule Take 25 mg by mouth at bedtime.  . [DISCONTINUED] ergocalciferol (VITAMIN D2) 50000 UNITS capsule Take 50,000 Units by mouth once a week.    Review of Systems Reports the head pain is better.  Saw Dr Manuella Ghazi.  See above.  No increased sinus or allergy symptoms.  No acid reflux.  No vomiting.  No nausea reported.   No abdominal pain or cramping.  No bowel change.  No urine change.   Periods regular.  Hair loss as outlined.  Better.  Takes BCs.  Trying to stop.   LMP 06/28/14.       Objective:   Physical Exam  Filed Vitals:   07/19/14 1034  BP: 101/67  Pulse: 96  Temp: 98.3 F (36.8 C)   Pulse recheck:  48  38 year old female in no acute distress.   HEENT:  Nares- clear.  Oropharynx - without lesions.  NECK:  Supple.  Nontender.  No audible bruit.  HEART:  Appears to be regular. LUNGS:  No crackles or wheezing audible.  Respirations even and unlabored.  RADIAL PULSE:  Equal bilaterally.   ABDOMEN:  Soft, nontender.  Bowel sounds present and normal.  No audible abdominal bruit.  GU:  Normal external genitalia.  Vaginal vault without lesions.  Cervix identified.  Pap smear performed.  Could not appreciate any adnexal masses or tenderness.      EXTREMITIES:  No increased edema present.  DP pulses palpable and equal bilaterally.      Assessment & Plan:  Screening cholesterol level - Lipid panel; Future  Pap smear for cervical cancer screening - Cytology - PAP  Hair loss Improved now.  Seeing Dr Evorn Gong.    Headache, unspecified headache  type Seeing Dr Manuella Ghazi.  He prescribed robaxin and nortriptyline.  Better.  Follow.    Stress Better.  Follow.     HEALTH MAINTENANCE.  Physical last visit.  Pap today.  Schedule baseline mammogram.

## 2014-07-23 LAB — CYTOLOGY - PAP

## 2014-07-24 NOTE — Progress Notes (Signed)
LMTCB on cell number.

## 2014-08-05 ENCOUNTER — Other Ambulatory Visit: Payer: BC Managed Care – PPO

## 2015-01-17 ENCOUNTER — Ambulatory Visit: Payer: BC Managed Care – PPO | Admitting: Internal Medicine

## 2015-01-27 ENCOUNTER — Ambulatory Visit: Payer: BC Managed Care – PPO | Admitting: Internal Medicine

## 2015-02-07 ENCOUNTER — Ambulatory Visit: Payer: Self-pay | Admitting: Internal Medicine

## 2016-03-23 ENCOUNTER — Ambulatory Visit (INDEPENDENT_AMBULATORY_CARE_PROVIDER_SITE_OTHER): Payer: BLUE CROSS/BLUE SHIELD | Admitting: Family

## 2016-03-23 ENCOUNTER — Encounter: Payer: Self-pay | Admitting: Family

## 2016-03-23 VITALS — BP 122/90 | HR 104 | Temp 98.3°F | Ht 60.0 in | Wt 124.0 lb

## 2016-03-23 DIAGNOSIS — R110 Nausea: Secondary | ICD-10-CM | POA: Diagnosis not present

## 2016-03-23 DIAGNOSIS — B370 Candidal stomatitis: Secondary | ICD-10-CM

## 2016-03-23 MED ORDER — ONDANSETRON 4 MG PO TBDP
4.0000 mg | ORAL_TABLET | Freq: Three times a day (TID) | ORAL | Status: DC | PRN
Start: 2016-03-23 — End: 2017-06-22

## 2016-03-23 MED ORDER — FLUCONAZOLE 100 MG PO TABS: ORAL_TABLET | ORAL | Status: DC

## 2016-03-23 NOTE — Progress Notes (Signed)
Subjective:    Patient ID: Michele Kim, female    DOB: February 20, 1976, 40 y.o.   MRN: [id number]   Michele Kim is a 40 y.o. female who presents today for an acute visit.    HPI Comments: Patient here for evaluation of nausea and fungal infection of tongue onset one month ago, waxing and waning. Vomiting triggered by bad taste in mouth, resolved however still nausea. Endorses changes to tongue with white film. Used Magic Mouthwash with resolve however then tongue symptoms came back.  No blood in emesis. She notes that her husband was recently diagnosed with fungal sinusitis and thrush, treated with `4 day of PO antifungal. No fever, chills, changes in vision, sinus pressure. Last BM today. H/o seasonal allergies.   Past Medical History  Diagnosis Date  . Frequent headaches     H/O  . Miscarriage     x 2  . Hair loss   . Vitamin D deficiency    Allergies: Review of patient's allergies indicates no known allergies. Current Outpatient Prescriptions on File Prior to Visit  Medication Sig Dispense Refill  . clindamycin (CLEOCIN T) 1 % external solution Apply 1 application topically daily. Reported on 03/23/2016    . clobetasol (TEMOVATE) 0.05 % external solution Apply 1 application topically daily. Reported on 03/23/2016    . methocarbamol (ROBAXIN) 750 MG tablet Take 750 mg by mouth 2 (two) times daily. Reported on 03/23/2016    . nortriptyline (PAMELOR) 25 MG capsule Take 25 mg by mouth at bedtime. Reported on 03/23/2016     No current facility-administered medications on file prior to visit.    Social History  Substance Use Topics  . Smoking status: Current Every Day Smoker  . Smokeless tobacco: Never Used  . Alcohol Use: 0.0 oz/week    0 Standard drinks or equivalent per week    Review of Systems  Constitutional: Negative for fever and chills.  HENT: Positive for ear pain (left) and sore throat (left side). Negative for congestion and rhinorrhea.   Eyes: Negative for visual  disturbance.  Respiratory: Negative for cough.   Cardiovascular: Negative for chest pain and palpitations.  Gastrointestinal: Negative for nausea, vomiting, abdominal pain and constipation.  Genitourinary: Negative for dysuria and hematuria.      Objective:    BP 122/90 mmHg  Pulse 104  Temp(Src) 98.3 F (36.8 C)  Ht 5' (1.524 m)  Wt 124 lb (56.246 kg)  BMI 24.22 kg/m2  SpO2 97%   Physical Exam  Constitutional: She appears well-developed and well-nourished.  HENT:  Head: Normocephalic and atraumatic.  Right Ear: Hearing, tympanic membrane, external ear and ear canal normal. No drainage, swelling or tenderness. No foreign bodies. Tympanic membrane is not erythematous and not bulging. No middle ear effusion. No decreased hearing is noted.  Left Ear: Hearing, tympanic membrane, external ear and ear canal normal. No drainage, swelling or tenderness. No foreign bodies. Tympanic membrane is not erythematous and not bulging.  No middle ear effusion. No decreased hearing is noted.  Nose: Nose normal. No rhinorrhea. Right sinus exhibits no maxillary sinus tenderness and no frontal sinus tenderness. Left sinus exhibits no maxillary sinus tenderness and no frontal sinus tenderness.  Mouth/Throat: Uvula is midline, oropharynx is clear and moist and mucous membranes are normal. No oral lesions. No uvula swelling. No oropharyngeal exudate, posterior oropharyngeal edema, posterior oropharyngeal erythema or tonsillar abscesses.  Yellow-white film over tongue. Non friable. No oral lesions over pharynx. Slight odor.   No sinus  tenderness.   Eyes: Conjunctivae are normal.  Cardiovascular: Normal rate, regular rhythm, normal heart sounds and normal pulses.   Pulmonary/Chest: Effort normal and breath sounds normal. She has no wheezes. She has no rhonchi. She has no rales.  Abdominal: Soft. Normal appearance and bowel sounds are normal. She exhibits no distension, no fluid wave, no ascites and no mass.  There is no tenderness. There is no rigidity, no rebound, no guarding and no CVA tenderness.  Lymphadenopathy:       Head (right side): No submental, no submandibular, no tonsillar, no preauricular, no posterior auricular and no occipital adenopathy present.       Head (left side): No submental, no submandibular, no tonsillar, no preauricular, no posterior auricular and no occipital adenopathy present.    She has no cervical adenopathy.  Neurological: She is alert.  Skin: Skin is warm and dry.  Psychiatric: She has a normal mood and affect. Her speech is normal and behavior is normal. Thought content normal.  Vitals reviewed.      Assessment & Plan:   1. Oral candidiasis Working diagnosis of candidiasis Pending throat culture. Offered testing for HIV however patient politely declined at this time.  - Upper Respiratory Culture - fluconazole (DIFLUCAN) 100 MG tablet; Take 2 tablets by mouth for 200 mg loading dose on day one, followed by one tablet by mouth for another 6 days.  Dispense: 8 tablet; Refill: 0  2. Nausea without vomiting No abdominal pain or pain during examination.Suspect nausea related to bad taste in mouth.  - ondansetron (ZOFRAN-ODT) 4 MG disintegrating tablet; Take 1 tablet (4 mg total) by mouth every 8 (eight) hours as needed for nausea or vomiting.  Dispense: 15 tablet; Refill: 0    I am having Ms. Senft maintain her clobetasol, clindamycin, methocarbamol, and nortriptyline.   No orders of the defined types were placed in this encounter.    Return precautions given.   Risks, benefits, and alternatives of the medications and treatment plan prescribed today were discussed, and patient expressed understanding.   Education regarding symptom management and diagnosis given to patient on AVS.  Continue to follow with Einar Pheasant, MD for routine health maintenance.   Michele Kim and I agreed with plan.   Mable Paris, FNP

## 2016-03-23 NOTE — Patient Instructions (Addendum)
Oral fluconazole. Extremity harmful in pregnancy.  If there is no improvement in your symptoms, or if there is any worsening of symptoms, or if you have any additional concerns, please return for re-evaluation; or, if we are closed, consider going to the Emergency Room for evaluation if symptoms urgent.  Thrush, Adult Ritta Slot, also called oral candidiasis, is a fungal infection that develops in the mouth and throat and on the tongue. It causes white patches to form on the mouth and tongue. Ritta Slot is most common in older adults, but it can occur at any age.  Many cases of thrush are mild, but this infection can also be more serious. Ritta Slot can be a recurring problem for people who have chronic illnesses or who take medicines that limit the body's ability to fight infection. Because these people have difficulty fighting infections, the fungus that causes thrush can spread throughout the body. This can cause life-threatening blood or organ infections. CAUSES  Ritta Slot is usually caused by a yeast called Candida albicans. This fungus is normally present in small amounts in the mouth and on other mucous membranes. It usually causes no harm. However, when conditions are present that allow the fungus to grow uncontrolled, it invades surrounding tissues and becomes an infection. Less often, other Candida species can also lead to thrush.  RISK FACTORS Ritta Slot is more likely to develop in the following people:  People with an impaired ability to fight infection (weakened immune system).   Older adults.   People with HIV.   People with diabetes.   People with dry mouth (xerostomia).   Pregnant women.   People with poor dental care, especially those who have false teeth.   People who use antibiotic medicines.  SIGNS AND SYMPTOMS  Ritta Slot can be a mild infection that causes no symptoms. If symptoms develop, they may include:   A burning feeling in the mouth and throat. This can occur at the start  of a thrush infection.   White patches that adhere to the mouth and tongue. The tissue around the patches may be red, raw, and painful. If rubbed (during tooth brushing, for example), the patches and the tissue of the mouth may bleed easily.   A bad taste in the mouth or difficulty tasting foods.   Cottony feeling in the mouth.   Pain during eating and swallowing. DIAGNOSIS  Your health care provider can usually diagnose thrush by looking in your mouth and asking you questions about your health.  TREATMENT  Medicines that help prevent the growth of fungi (antifungals) are the standard treatment for thrush. These medicines are either applied directly to the affected area (topical) or swallowed (oral). The treatment will depend on the severity of the condition.  Mild Thrush Mild cases of thrush may clear up with the use of an antifungal mouth rinse or lozenges. Treatment usually lasts about 14 days.  Moderate to Severe Thrush  More severe thrush infections that have spread to the esophagus are treated with an oral antifungal medicine. A topical antifungal medicine may also be used.   For some severe infections, a treatment period longer than 14 days may be needed.   Oral antifungal medicines are almost never used during pregnancy because the fetus may be harmed. However, if a pregnant woman has a rare, severe thrush infection that has spread to her blood, oral antifungal medicines may be used. In this case, the risk of harm to the mother and fetus from the severe thrush infection may be greater  than the risk posed by the use of antifungal medicines.  Persistent or Recurrent Thrush For cases of thrush that do not go away or keep coming back, treatment may involve the following:   Treatment may be needed twice as long as the symptoms last.   Treatment will include both oral and topical antifungal medicines.   People with weakened immune systems can take an antifungal medicine on a  continuous basis to prevent thrush infections.  It is important to treat conditions that make you more likely to get thrush, such as diabetes or HIV.  HOME CARE INSTRUCTIONS   Only take over-the-counter or prescription medicine as directed by your health care provider. Talk to your health care provider about an over-the-counter medicine called gentian violet, which kills bacteria and fungi.   Eat plain, unflavored yogurt as directed by your health care provider. Check the label to make sure the yogurt contains live cultures. This yogurt can help healthy bacteria grow in the mouth that can stop the growth of the fungus that causes thrush.   Try these measures to help reduce the discomfort of thrush:   Drink cold liquids such as water or iced tea.   Try flavored ice treats or frozen juices.   Eat foods that are easy to swallow, such as gelatin, ice cream, or custard.   If the patches in your mouth are painful, try drinking from a straw.   Rinse your mouth several times a day with a warm saltwater rinse. You can make the saltwater mixture with 1 tsp (6 g) of salt in 8 fl oz (0.2 L) of warm water.   If you wear dentures, remove the dentures before going to bed, brush them vigorously, and soak them in a cleaning solution as directed by your health care provider.   Women who are breastfeeding should clean their nipples with an antifungal medicine as directed by their health care provider. Dry the nipples after breastfeeding. Applying lanolin-containing body lotion may help relieve nipple soreness.  SEEK MEDICAL CARE IF:  Your symptoms are getting worse or are not improving within 7 days of starting treatment.   You have symptoms of spreading infection, such as white patches on the skin outside of the mouth.   You are nursing and you have redness, burning, or pain in the nipples that is not relieved with treatment.  MAKE SURE YOU:  Understand these instructions.  Will watch  your condition.  Will get help right away if you are not doing well or get worse.   This information is not intended to replace advice given to you by your health care provider. Make sure you discuss any questions you have with your health care provider.   Document Released: 05/18/2004 Document Revised: 09/13/2014 Document Reviewed: 03/26/2013 Elsevier Interactive Patient Education [employer].

## 2016-03-26 LAB — CULTURE, UPPER RESPIRATORY: Organism ID, Bacteria: NORMAL

## 2016-03-29 ENCOUNTER — Telehealth: Payer: Self-pay | Admitting: *Deleted

## 2016-03-29 NOTE — Telephone Encounter (Signed)
Patient will need an appt for issues, thanks

## 2016-03-29 NOTE — Telephone Encounter (Signed)
I have not seen her since 2015.  If she is having persistent symptoms, she needs to be evaluated.

## 2016-03-29 NOTE — Telephone Encounter (Signed)
Patient had a fungal infection, she completed the medication with improvements,however her tongue is discolored with a weird taste in her mouth . She questioned her next step of care, or if she should have another round of medication. Pt contact [phone] Patient has a brown and white film substance on tongue, that has lighten up, but still present .

## 2016-03-29 NOTE — Telephone Encounter (Signed)
Please advise for thrush, post antibiotic, thanks

## 2016-03-31 ENCOUNTER — Telehealth: Payer: Self-pay | Admitting: Internal Medicine

## 2016-03-31 ENCOUNTER — Ambulatory Visit: Payer: BLUE CROSS/BLUE SHIELD | Admitting: Family Medicine

## 2016-03-31 DIAGNOSIS — Z0289 Encounter for other administrative examinations: Secondary | ICD-10-CM

## 2016-03-31 NOTE — Telephone Encounter (Signed)
Pt stated that she no longer wants to be a pt at [facility]  Pt was late for an appt on 03/31/16 she got very rude when I told her we couldn't she her because she was late.Marland Kitchen

## 2017-06-21 ENCOUNTER — Encounter: Payer: Self-pay | Admitting: *Deleted

## 2017-06-22 ENCOUNTER — Ambulatory Visit: Payer: Self-pay | Admitting: Internal Medicine

## 2017-06-22 ENCOUNTER — Ambulatory Visit (INDEPENDENT_AMBULATORY_CARE_PROVIDER_SITE_OTHER): Payer: Self-pay | Admitting: Internal Medicine

## 2017-06-22 ENCOUNTER — Encounter: Payer: Self-pay | Admitting: Internal Medicine

## 2017-06-22 ENCOUNTER — Other Ambulatory Visit (INDEPENDENT_AMBULATORY_CARE_PROVIDER_SITE_OTHER): Payer: Self-pay

## 2017-06-22 VITALS — BP 150/104 | HR 84 | Ht 60.25 in | Wt 124.0 lb

## 2017-06-22 DIAGNOSIS — R109 Unspecified abdominal pain: Secondary | ICD-10-CM

## 2017-06-22 DIAGNOSIS — R197 Diarrhea, unspecified: Secondary | ICD-10-CM

## 2017-06-22 DIAGNOSIS — B370 Candidal stomatitis: Secondary | ICD-10-CM

## 2017-06-22 DIAGNOSIS — R112 Nausea with vomiting, unspecified: Secondary | ICD-10-CM

## 2017-06-22 LAB — TSH: TSH: 0.35 u[IU]/mL (ref 0.35–4.50)

## 2017-06-22 LAB — IGA: IGA: 202 mg/dL (ref 68–378)

## 2017-06-22 LAB — HIGH SENSITIVITY CRP: CRP HIGH SENSITIVITY: 1.51 mg/L (ref 0.000–5.000)

## 2017-06-22 MED ORDER — SUPREP BOWEL PREP KIT 17.5-3.13-1.6 GM/177ML PO SOLN: 1.0000 | ORAL | 0 refills | Status: DC

## 2017-06-22 NOTE — Progress Notes (Signed)
Patient ID: Michele Kim, female   DOB: 1976-01-01, 41 y.o.   MRN: [id number] HPI: Michele Kim is a 41 year old female with a past medical history of vitamin D deficiency, recurrent oral thrush, headaches, IBS who is here with her mother for evaluation of nausea, vomiting and diarrhea.  She reports that over the last 1-2 year she has had issues with nausea, episodes of vomiting and episodes of diarrhea. Initially this was happening sporadically and she was suspicious for viral enteritis though now it has become more frequent. She states that she is frequently nauseous but will develop episodes of intense vomiting. Vomiting is nonbloody and usually only liquid. She denies vomiting of food or undigested food. No known trigger or dietary trigger. She denies heartburn and dysphagia. She is having epigastric pain which is worse when she is having vomiting episodes. She's lost 10-15 pounds in the last 1-2 years. She has been treated several times with Magic mouthwash for thrush. She reports feeling that she has thrush again now.  From a bowel perspective she has a history of irritable bowel with constipation. This has not been an issue of late. More recently her stools are daily and formed though she has these episodes of significant severe urgent, watery diarrhea. It often goes along with vomiting but can occur without vomiting episodes. Stools are described as very dark but without overt blood. This is associated with moderate to severe lower abdominal cramping pain which is often better after bowel movement.  She was seen in an emergency room yesterday for nausea, vomiting and diarrhea. She had a CBC which was normal though she had an absolute eosinophil count which was mildly elevated. Her chloride was slightly elevated. Her liver enzymes were normal. Total protein slightly low. Albumin normal at 3.5. no imaging was performed  Last menstrual period began today. Periods are regular for her which is about  every 5 weeks lasting 3-4 days.   She is married with one 8 year old daughter. She works in accounts able. Rare alcohol. Current smoker. Denies illicit drug use. No family history of GI tract malignancy or IBD. Family history positive for IBS  Past Medical History:  Diagnosis Date  . Frequent headaches    H/O  . Hair loss   . IBS (irritable bowel syndrome)   . Miscarriage    x 2  . Vitamin D deficiency     Past Surgical History:  Procedure Laterality Date  . miscarriage  2014    Outpatient Medications Prior to Visit  Medication Sig Dispense Refill  . clindamycin (CLEOCIN T) 1 % external solution Apply 1 application topically daily. Reported on 03/23/2016    . clobetasol (TEMOVATE) 0.05 % external solution Apply 1 application topically daily. Reported on 03/23/2016    . fluconazole (DIFLUCAN) 100 MG tablet Take 2 tablets by mouth for 200 mg loading dose on day one, followed by one tablet by mouth for another 6 days. 8 tablet 0  . methocarbamol (ROBAXIN) 750 MG tablet Take 750 mg by mouth 2 (two) times daily. Reported on 03/23/2016    . nortriptyline (PAMELOR) 25 MG capsule Take 25 mg by mouth at bedtime. Reported on 03/23/2016    . ondansetron (ZOFRAN-ODT) 4 MG disintegrating tablet Take 1 tablet (4 mg total) by mouth every 8 (eight) hours as needed for nausea or vomiting. 15 tablet 0   No facility-administered medications prior to visit.     No Known Allergies  Family History  Problem Relation Age of Onset  .  Breast cancer Mother   . Hyperlipidemia Mother   . Hypertension Mother   . Heart disease Father   . Lung cancer Maternal Uncle   . Heart disease Maternal Grandmother   . Hypertension Maternal Grandmother   . Diabetes Maternal Grandmother   . Alcohol abuse Maternal Grandfather   . Stroke Paternal Grandfather   . Alcohol abuse Paternal Grandfather   . Lung disease Paternal Grandfather   . Anxiety disorder Brother   . Breast cancer Maternal Aunt   . Heart disease  Paternal Aunt   . Heart disease Paternal Aunt   . Heart disease Paternal Aunt   . Diabetes Maternal Aunt   . Colon cancer Neg Hx     Social History  Substance Use Topics  . Smoking status: Current Every Day Smoker  . Smokeless tobacco: Never Used  . Alcohol use 0.0 oz/week     Comment: occasional    ROS: As per history of present illness, otherwise negative  BP (!) 150/104 (BP Location: Left Arm, Patient Position: Sitting, Cuff Size: Normal)   Pulse 84   Ht 5' 0.25" (1.53 m) Comment: height measured without shoes  Wt 124 lb (56.2 kg)   LMP 06/22/2017   BMI 24.02 kg/m  Constitutional: Well-developed and well-nourished, somewhat disheveled. No distress. HEENT: Normocephalic and atraumatic. Oropharynx reveals whitish plaque on tongue consistent with thrush, buccal mucosa is clear. Conjunctivae are normal.  No scleral icterus. Neck: Neck supple. Trachea midline. Cardiovascular: Normal rate, regular rhythm and intact distal pulses. No M/R/G Pulmonary/chest: Effort normal and breath sounds normal. No wheezing, rales or rhonchi. Abdominal: Soft, epigastric and lower abdominal tenderness without rebound or guarding, nondistended. Bowel sounds active throughout. There are no masses palpable. No hepatosplenomegaly. Extremities: no clubbing, cyanosis, or edema Neurological: Alert and oriented to person place and time. Skin: Skin is warm and dry.  Psychiatric: Normal mood and affect. Behavior is normal.  RELEVANT LABS AND IMAGING: Labs reviewed via care everywhere  ASSESSMENT/PLAN: 42 year old female with a past medical history of vitamin D deficiency, recurrent oral thrush, headaches, IBS who is here with her mother for evaluation of nausea, vomiting and diarrhea.   1. N/V/episodic diarrhea/weight loss/oral thrush -- constellations of symptoms both upper and lower of unclear etiology. I recommended upper endoscopy and colonoscopy to rule out peptic ulcer disease, H. pylori,  inflammatory bowel disease. We discussed the risks, benefits and alternatives and she is agreeable and wishes to proceed. Check TSH, HIV, CRP, celiac panel, lipase, stool for ova and parasite and fecal leukocyte. --Magic mouthwash for oral thrush --If endoscopy is unrevealing consider cross-sectional imaging of the abdomen, could also consider rifaximin for IBS-D

## 2017-06-22 NOTE — Patient Instructions (Signed)
You have been scheduled for an endoscopy and colonoscopy. Please follow the written instructions given to you at your visit today. Please pick up your prep supplies at the pharmacy within the next 1-3 days. If you use inhalers (even only as needed), please bring them with you on the day of your procedure. Your physician has requested that you go to www.startemmi.com and enter the access code given to you at your visit today. This web site gives a general overview about your procedure. However, you should still follow specific instructions given to you by our office regarding your preparation for the procedure.  Your physician has requested that you go to the basement for the following lab work before leaving today: TSH, HIV, CRP, Celiac, Lipase, Fecal leukocytes, O&P  If you are age 75 or older, your body mass index should be between 23-30. Your Body mass index is 24.02 kg/m. If this is out of the aforementioned range listed, please consider follow up with your Primary Care Provider.  If you are age 46 or younger, your body mass index should be between 19-25. Your Body mass index is 24.02 kg/m. If this is out of the aformentioned range listed, please consider follow up with your Primary Care Provider.

## 2017-06-23 LAB — TISSUE TRANSGLUTAMINASE, IGA: (tTG) Ab, IgA: 1 U/mL

## 2017-06-23 LAB — HIV ANTIBODY (ROUTINE TESTING W REFLEX): HIV: NONREACTIVE

## 2017-06-27 ENCOUNTER — Telehealth: Payer: Self-pay | Admitting: Internal Medicine

## 2017-06-27 MED ORDER — MAGIC MOUTHWASH: 10.0000 mL | Freq: Four times a day (QID) | ORAL | 1 refills | Status: DC | PRN

## 2017-06-27 NOTE — Telephone Encounter (Signed)
Per Dr Hilarie Fredrickson, patient may have refills on magic mouthwash originally given to her by [facility]. Magic mouthwash should have diphenhydramine and nystatin in it. Per pharmacist at [facility], the solution patient has been getting does have the diphenhydramine and nystatin. I have sent rx for magic mouthwash, take 10 ml qid prn for mouth pain, 1200 ml 1refill.

## 2017-07-22 ENCOUNTER — Ambulatory Visit: Payer: Self-pay | Admitting: *Deleted

## 2017-07-22 ENCOUNTER — Ambulatory Visit
Admission: EM | Admit: 2017-07-22 | Discharge: 2017-07-22 | Disposition: A | Discharge status: Home / Self Care | Payer: Self-pay | Attending: Family Medicine | Admitting: Family Medicine

## 2017-07-22 DIAGNOSIS — B370 Candidal stomatitis: Secondary | ICD-10-CM

## 2017-07-22 DIAGNOSIS — G44209 Tension-type headache, unspecified, not intractable: Secondary | ICD-10-CM

## 2017-07-22 DIAGNOSIS — L989 Disorder of the skin and subcutaneous tissue, unspecified: Secondary | ICD-10-CM

## 2017-07-22 MED ORDER — MAGIC MOUTHWASH: 10.0000 mL | Freq: Four times a day (QID) | ORAL | 0 refills | Status: DC | PRN

## 2017-07-22 MED ORDER — HYDROCODONE-ACETAMINOPHEN 5-325 MG PO TABS: ORAL_TABLET | ORAL | 0 refills | Status: DC

## 2017-07-22 MED ORDER — FLUCONAZOLE 150 MG PO TABS: 150.0000 mg | ORAL_TABLET | Freq: Every day | ORAL | 1 refills | Status: DC

## 2017-07-22 MED ORDER — PROMETHAZINE HCL 25 MG PO TABS: 25.0000 mg | ORAL_TABLET | Freq: Four times a day (QID) | ORAL | 0 refills | Status: DC | PRN

## 2017-07-22 NOTE — ED Provider Notes (Addendum)
[facility]    CSN: [id number] Arrival date & time: 07/22/17  1055     History   Chief Complaint No chief complaint on file.   HPI Michele Kim is a 41 y.o. female.   41 yo female with a c/o headache, nausea, thrush for 4 days. Patient states been having these symptoms chronically and intermittently for the past year and has been seen by PCP, in the ER and most recently by gastroenterology. Has an endoscopy and colonoscopy scheduled in 2 weeks. Symptoms are associated with weight loss and a rash over the past 2 weeks.   Recent blood tests ordered by GI were reviewed with patient. Blood tests were normal/negative (including test for HIV).    The history is provided by the patient.    Past Medical History:  Diagnosis Date  . Frequent headaches    H/O  . Hair loss   . IBS (irritable bowel syndrome)   . Miscarriage    x 2  . Vitamin D deficiency     Patient Active Problem List   Diagnosis Date Noted  . Hair loss 02/05/2014  . Head pain 02/05/2014  . Miscarriage 02/05/2014  . Vitamin D deficiency 02/05/2014  . Stress 02/05/2014    Past Surgical History:  Procedure Laterality Date  . miscarriage  2014    OB History    No data available       Home Medications    Prior to Admission medications   Medication Sig Start Date End Date Taking? Authorizing Provider  DIPHENHYDRAMINE HCL PO Take 10 mLs by mouth 4 (four) times daily as needed. Magic mouth wash   Yes [provider]  ondansetron (ZOFRAN-ODT) 4 MG disintegrating tablet Take 4 mg by mouth every 8 (eight) hours as needed for nausea or vomiting.   Yes [provider]  fluconazole (DIFLUCAN) 150 MG tablet Take 1 tablet (150 mg total) daily by mouth. 07/22/17   Norval Gable, MD  HYDROcodone-acetaminophen (NORCO/VICODIN) 5-325 MG tablet 1-2 tabs po bid prn 07/22/17   Norval Gable, MD  magic mouthwash SOLN Take 10 mLs 4 (four) times daily as needed by mouth. 07/22/17   Norval Gable, MD  promethazine (PHENERGAN) 25 MG tablet Take 1 tablet (25 mg total) every 6 (six) hours as needed by mouth for nausea or vomiting. 07/22/17   Norval Gable, MD  SUPREP BOWEL PREP KIT 17.5-3.13-1.6 GM/177ML SOLN Take 1 kit by mouth as directed. 06/22/17   Pyrtle, Lajuan Lines, MD    Family History Family History  Problem Relation Age of Onset  . Breast cancer Mother   . Hyperlipidemia Mother   . Hypertension Mother   . Heart disease Father   . Lung cancer Maternal Uncle   . Heart disease Maternal Grandmother   . Hypertension Maternal Grandmother   . Diabetes Maternal Grandmother   . Alcohol abuse Maternal Grandfather   . Stroke Paternal Grandfather   . Alcohol abuse Paternal Grandfather   . Lung disease Paternal Grandfather   . Anxiety disorder Brother   . Breast cancer Maternal Aunt   . Heart disease Paternal Aunt   . Heart disease Paternal Aunt   . Heart disease Paternal Aunt   . Diabetes Maternal Aunt   . Colon cancer Neg Hx     Social History Social History   Tobacco Use  . Smoking status: Current Every Day Smoker  . Smokeless tobacco: Never Used  Substance Use Topics  . Alcohol use: Yes  Alcohol/week: 0.0 oz    Comment: occasional  . Drug use: No     Allergies   Patient has no known allergies.   Review of Systems Review of Systems   Physical Exam Triage Vital Signs ED Triage Vitals  Enc Vitals Group     BP 07/22/17 1117 (!) 144/97     Pulse Rate 07/22/17 1117 100     Resp 07/22/17 1117 16     Temp 07/22/17 1117 98.6 F (37 C)     Temp Source 07/22/17 1117 Oral     SpO2 07/22/17 1117 100 %     Weight 07/22/17 1115 128 lb (58.1 kg)     Height 07/22/17 1115 5' (1.524 m)     Head Circumference --      Peak Flow --      Pain Score 07/22/17 1115 8     Pain Loc --      Pain Edu? --      Excl. in [place]? --    No data found.  Updated Vital Signs BP (!) 144/97 (BP Location: Left Arm)   Pulse 100   Temp 98.6 F (37 C) (Oral)   Resp 16   Ht  5' (1.524 m)   Wt 128 lb (58.1 kg)   LMP 06/22/2017   SpO2 100%   BMI 25.00 kg/m   Visual Acuity Right Eye Distance:   Left Eye Distance:   Bilateral Distance:    Right Eye Near:   Left Eye Near:    Bilateral Near:     Physical Exam  Constitutional: She is oriented to person, place, and time. She appears well-developed and well-nourished. No distress.  HENT:  Head: Normocephalic and atraumatic.  Right Ear: Tympanic membrane, external ear and ear canal normal.  Left Ear: Tympanic membrane, external ear and ear canal normal.  Nose: No mucosal edema, rhinorrhea, nose lacerations, sinus tenderness, nasal deformity, septal deviation or nasal septal hematoma. No epistaxis.  No foreign bodies. Right sinus exhibits no maxillary sinus tenderness and no frontal sinus tenderness. Left sinus exhibits no maxillary sinus tenderness and no frontal sinus tenderness.  Mouth/Throat: Uvula is midline and mucous membranes are normal. Oral lesions present. Posterior oropharyngeal erythema present. No oropharyngeal exudate, posterior oropharyngeal edema or tonsillar abscesses. No tonsillar exudate.  Eyes: Conjunctivae and EOM are normal. Pupils are equal, round, and reactive to light. Right eye exhibits no discharge. Left eye exhibits no discharge. No scleral icterus.  Neck: Normal range of motion. Neck supple. No thyromegaly present.  Cardiovascular: Normal rate, regular rhythm and normal heart sounds.  Pulmonary/Chest: Effort normal and breath sounds normal. No respiratory distress. She has no wheezes. She has no rales.  Abdominal: Soft. Bowel sounds are normal. She exhibits no distension and no mass. There is no tenderness. There is no rebound and no guarding. No hernia.  Lymphadenopathy:    She has no cervical adenopathy.  Neurological: She is alert and oriented to person, place, and time. She displays normal reflexes. No cranial nerve deficit or sensory deficit. She exhibits normal muscle tone.  Coordination normal.  Skin: Rash noted. She is not diaphoretic.  Nursing note and vitals reviewed.    UC Treatments / Results  Labs (all labs ordered are listed, but only abnormal results are displayed) Labs Reviewed - No data to display  EKG  EKG Interpretation None       Radiology No results found.  Procedures Procedures (including critical care time)  Medications Ordered in UC Medications -  No data to display   Initial Impression / Assessment and Plan / UC Course  I have reviewed the triage vital signs and the nursing notes.  Pertinent labs & imaging results that were available during my care of the patient were reviewed by me and considered in my medical decision making (see chart for details).       Final Clinical Impressions(s) / UC Diagnoses   Final diagnoses:  Thrush, oral  Skin lesions  Tension headache    ED Discharge Orders        Ordered    magic mouthwash SOLN  4 times daily PRN     07/22/17 1156    promethazine (PHENERGAN) 25 MG tablet  Every 6 hours PRN     07/22/17 1156    fluconazole (DIFLUCAN) 150 MG tablet  Daily     07/22/17 1156    HYDROcodone-acetaminophen (NORCO/VICODIN) 5-325 MG tablet     07/22/17 1156     1. Reviewed recent blood tests with patient and possible diagnosis 2. rx as per orders above; reviewed possible side effects, interactions, risks and benefits  3. Recommend supportive treatment with fluids  4. Keep follow up appointment with GI in the next 2 weeks; follow up with PCP as well 5. Follow-up prn if symptoms worsen or don't improve  Controlled Substance Prescriptions [place] Controlled Substance Registry consulted? Not Applicable   Norval Gable, MD 07/22/17 [place], [place], MD 07/22/17 [phone]

## 2017-07-22 NOTE — Telephone Encounter (Signed)
This is not our patient.

## 2017-07-22 NOTE — ED Triage Notes (Signed)
HA, nausea onset 4 days, purple spot scaling skin onset week, thrust onset 3 weeks magic mouthwash works when she use it and when she is finished it will come back, string of slimy stuff comes out from her eyes and nose may be spreading fungal infection as per patient but not sure.

## 2017-07-22 NOTE — Discharge Instructions (Signed)
Follow up with Primary Physician and Gastroenterologist as scheduled

## 2017-07-22 NOTE — Telephone Encounter (Signed)
Care advice given to pt's mom to given to patient. Call us back if she gets works before appointment.  Reason for Disposition . Headache is a chronic symptom (recurrent or ongoing AND present > 4 weeks)  Answer Assessment - Initial Assessment Questions 1. LOCATION: "Where does it hurt?"      Around the scalp 2. ONSET: "When did the headache start?" (Minutes, hours or days)      2 years 3. PATTERN: "Does the pain come and go, or has it been constant since it started?"     Not sure 4. SEVERITY: "How bad is the pain?" and "What does it keep you from doing?"  (e.g., Scale 1-10; mild, moderate, or severe)   - MILD (1-3): doesn't interfere with normal activities    - MODERATE (4-7): interferes with normal activities or awakens from sleep    - SEVERE (8-10): excruciating pain, unable to do any normal activities        10 5. RECURRENT SYMPTOM: "Have you ever had headaches before?" If so, ask: "When was the last time?" and "What happened that time?"      no 6. CAUSE: "What do you think is causing the headache?"     Not sure 7. MIGRAINE: "Have you been diagnosed with migraine headaches?" If so, ask: "Is this headache similar?"      Not sure 8. HEAD INJURY: "Has there been any recent injury to the head?"      no 9. OTHER SYMPTOMS: "Do you have any other symptoms?" (fever, stiff neck, eye pain, sore throat, cold symptoms)     Eye drainage 10. PREGNANCY: "Is there any chance you are pregnant?" "When was your last menstrual period?"       No, LMP now  Protocols used: HEADACHE-A-AH

## 2017-07-25 ENCOUNTER — Ambulatory Visit: Payer: Self-pay | Admitting: Family Medicine

## 2017-08-01 ENCOUNTER — Encounter: Payer: Self-pay | Admitting: Internal Medicine

## 2017-08-04 ENCOUNTER — Ambulatory Visit (AMBULATORY_SURGERY_CENTER): Payer: Self-pay | Admitting: Internal Medicine

## 2017-08-04 ENCOUNTER — Encounter: Payer: Self-pay | Admitting: Internal Medicine

## 2017-08-04 VITALS — BP 147/89 | HR 78 | Temp 98.4°F | Resp 15 | Ht 61.25 in | Wt 124.0 lb

## 2017-08-04 DIAGNOSIS — R1084 Generalized abdominal pain: Secondary | ICD-10-CM

## 2017-08-04 DIAGNOSIS — K599 Functional intestinal disorder, unspecified: Secondary | ICD-10-CM

## 2017-08-04 DIAGNOSIS — D125 Benign neoplasm of sigmoid colon: Secondary | ICD-10-CM

## 2017-08-04 DIAGNOSIS — R634 Abnormal weight loss: Secondary | ICD-10-CM

## 2017-08-04 DIAGNOSIS — R112 Nausea with vomiting, unspecified: Secondary | ICD-10-CM

## 2017-08-04 DIAGNOSIS — K635 Polyp of colon: Secondary | ICD-10-CM

## 2017-08-04 DIAGNOSIS — R197 Diarrhea, unspecified: Secondary | ICD-10-CM

## 2017-08-04 DIAGNOSIS — K319 Disease of stomach and duodenum, unspecified: Secondary | ICD-10-CM

## 2017-08-04 MED ORDER — PANTOPRAZOLE SODIUM 40 MG PO TBEC: 40.0000 mg | DELAYED_RELEASE_TABLET | Freq: Every day | ORAL | 3 refills | Status: DC

## 2017-08-04 MED ORDER — SODIUM CHLORIDE 0.9 % IV SOLN: 500.0000 mL | INTRAVENOUS | Status: DC

## 2017-08-04 NOTE — Progress Notes (Signed)
Report given to PACU, vss 

## 2017-08-04 NOTE — Op Note (Addendum)
[facility] Patient Name: Michele Kim Procedure Date: 08/04/2017 1:22 PM MRN: [id number] Endoscopist: Michele Kim , MD Age: 41 Referring MD:  Date of Birth: Nov 28, 1975 Gender: Female Account #: [id number] Procedure:                Upper GI endoscopy Indications:              Nausea with vomiting, Weight loss Medicines:                Monitored Anesthesia Care Procedure:                Pre-Anesthesia Assessment:                           - Prior to the procedure, a History and Physical                            was performed, and patient medications and                            allergies were reviewed. The patient's tolerance of                            previous anesthesia was also reviewed. The risks                            and benefits of the procedure and the sedation                            options and risks were discussed with the patient.                            All questions were answered, and informed consent                            was obtained. Prior Anticoagulants: The patient has                            taken no previous anticoagulant or antiplatelet                            agents. ASA Grade Assessment: II - A patient with                            mild systemic disease. After reviewing the risks                            and benefits, the patient was deemed in                            satisfactory condition to undergo the procedure.                           After obtaining informed consent, the endoscope was  passed under direct vision. Throughout the                            procedure, the patient's blood pressure, pulse, and                            oxygen saturations were monitored continuously. The                            Endoscope was introduced through the mouth, and                            advanced to the second part of duodenum. The upper                            GI endoscopy was  accomplished without difficulty.                            The patient tolerated the procedure well. Scope In: Scope Out: Findings:                 The examined esophagus was normal. Z-line regular                            at 39 cm.                           Localized moderate inflammation characterized by                            erosions and erythema was found in the gastric                            antrum and in the prepyloric region of the stomach.                            There was a single, small, clean-based, gastic                            ulcer in the antrum. Biopsies were taken with a                            cold forceps for histology and Helicobacter pylori                            testing (at ulcer, antrum, incisura, and antrum).                           The cardia and gastric fundus were normal on                            retroflexion.                           The examined duodenum was  normal. Biopsies for                            histology were taken with a cold forceps for                            evaluation of celiac disease. Complications:            No immediate complications. Estimated Blood Loss:     Estimated blood loss was minimal. Impression:               - Normal esophagus.                           - Gastritis and small gastric ulcer. Biopsied.                           - Normal examined duodenum. Biopsied. Recommendation:           - Patient has a contact number available for                            emergencies. The signs and symptoms of potential                            delayed complications were discussed with the                            patient. Return to normal activities tomorrow.                            Written discharge instructions were provided to the                            patient.                           - Resume previous diet.                           - Continue present medications.                            - Avoid aspirin, ibuprofen, naproxen, or other                            non-steroidal anti-inflammatory drugs.                           - Begin pantoprazole 40 mg once daily given                            gastritis and gastric ulcer.                           - Await pathology results.                           -  See the other procedure note for documentation of                            additional recommendations.                           - Office follow-up next available. Michele Bears, MD 08/04/2017 2:18:56 PM This report has been signed electronically.

## 2017-08-04 NOTE — Patient Instructions (Signed)
YOU HAD AN ENDOSCOPIC PROCEDURE TODAY AT [facility]:   Refer to the procedure report that was given to you for any specific questions about what was found during the examination.  If the procedure report does not answer your questions, please call your gastroenterologist to clarify.  If you requested that your care partner not be given the details of your procedure findings, then the procedure report has been included in a sealed envelope for you to review at your convenience later.  YOU SHOULD EXPECT: Some feelings of bloating in the abdomen. Passage of more gas than usual.  Walking can help get rid of the air that was put into your GI tract during the procedure and reduce the bloating. If you had a lower endoscopy (such as a colonoscopy or flexible sigmoidoscopy) you may notice spotting of blood in your stool or on the toilet paper. If you underwent a bowel prep for your procedure, you may not have a normal bowel movement for a few days.  Please Note:  You might notice some irritation and congestion in your nose or some drainage.  This is from the oxygen used during your procedure.  There is no need for concern and it should clear up in a day or so.  SYMPTOMS TO REPORT IMMEDIATELY:   Following lower endoscopy (colonoscopy or flexible sigmoidoscopy):  Excessive amounts of blood in the stool  Significant tenderness or worsening of abdominal pains  Swelling of the abdomen that is new, acute  Fever of 100F or higher   Following upper endoscopy (EGD)  Vomiting of blood or coffee ground material  New chest pain or pain under the shoulder blades  Painful or persistently difficult swallowing  New shortness of breath  Fever of 100F or higher  Black, tarry-looking stools  Please see handouts given by recovery nurse today. Begin Pantoprazole daily, sent to your pharmacy.  For urgent or emergent issues, a gastroenterologist can be reached at any hour by calling [phone].   DIET:  We do recommend a small meal at first, but then you may proceed to your regular diet.  Drink plenty of fluids but you should avoid alcoholic beverages for 24 hours.  ACTIVITY:  You should plan to take it easy for the rest of today and you should NOT DRIVE or use heavy machinery until tomorrow (because of the sedation medicines used during the test).    FOLLOW UP: Our staff will call the number listed on your records the next business day following your procedure to check on you and address any questions or concerns that you may have regarding the information given to you following your procedure. If we do not reach you, we will leave a message.  However, if you are feeling well and you are not experiencing any problems, there is no need to return our call.  We will assume that you have returned to your regular daily activities without incident.  If any biopsies were taken you will be contacted by phone or by letter within the next 1-3 weeks.  Please call us at [phone] if you have not heard about the biopsies in 3 weeks.    SIGNATURES/CONFIDENTIALITY: You and/or your care partner have signed paperwork which will be entered into your electronic medical record.  These signatures attest to the fact that that the information above on your After Visit Summary has been reviewed and is understood.  Full responsibility of the confidentiality of this discharge information lies with  you and/or your care-partner.  Thank you for letting us take care of your healthcare needs today.

## 2017-08-04 NOTE — Op Note (Signed)
[facility] Patient Name: Michele Kim Procedure Date: 08/04/2017 1:22 PM MRN: [id number] Endoscopist: Jerene Bears , MD Age: 41 Referring MD:  Date of Birth: 10/18/1975 Gender: Female Account #: [id number] Procedure:                Colonoscopy Indications:              Clinically significant diarrhea of unexplained                            origin, Weight loss Medicines:                Monitored Anesthesia Care Procedure:                Pre-Anesthesia Assessment:                           - Prior to the procedure, a History and Physical                            was performed, and patient medications and                            allergies were reviewed. The patient's tolerance of                            previous anesthesia was also reviewed. The risks                            and benefits of the procedure and the sedation                            options and risks were discussed with the patient.                            All questions were answered, and informed consent                            was obtained. Prior Anticoagulants: The patient has                            taken no previous anticoagulant or antiplatelet                            agents. ASA Grade Assessment: II - A patient with                            mild systemic disease. After reviewing the risks                            and benefits, the patient was deemed in                            satisfactory condition to undergo the procedure.  After obtaining informed consent, the colonoscope                            was passed under direct vision. Throughout the                            procedure, the patient's blood pressure, pulse, and                            oxygen saturations were monitored continuously. The                            Model PCF-H190DL [id number]) scope was introduced                            through the anus and advanced to the the  terminal                            ileum. The colonoscopy was performed without                            difficulty. The patient tolerated the procedure                            well. The quality of the bowel preparation was                            good. The terminal ileum, ileocecal valve,                            appendiceal orifice, and rectum were photographed. Scope In: 2:00:29 PM Scope Out: 2:13:43 PM Scope Withdrawal Time: 0 hours 10 minutes 19 seconds  Total Procedure Duration: 0 hours 13 minutes 14 seconds  Findings:                 The digital rectal exam was normal.                           The terminal ileum appeared normal.                           Two sessile polyps were found in the sigmoid colon.                            The polyps were 2 to 3 mm in size. These polyps                            were removed with a cold biopsy forceps. Resection                            and retrieval were complete.                           A few small-mouthed diverticula were found in the  sigmoid colon.                           The exam was otherwise normal throughout the                            examined colon.                           Biopsies for histology were taken with a cold                            forceps from the right colon and left colon for                            evaluation of microscopic colitis. Complications:            No immediate complications. Estimated Blood Loss:     Estimated blood loss was minimal. Impression:               - The examined portion of the ileum was normal.                           - Two 2 to 3 mm polyps in the sigmoid colon,                            removed with a cold biopsy forceps. Resected and                            retrieved.                           - Diverticulosis in the sigmoid colon.                           - Biopsies were taken with a cold forceps from the                             right colon and left colon for evaluation of                            microscopic colitis. Recommendation:           - Patient has a contact number available for                            emergencies. The signs and symptoms of potential                            delayed complications were discussed with the                            patient. Return to normal activities tomorrow.                            Written discharge instructions were provided  to the                            patient.                           - Resume previous diet.                           - Continue present medications.                           - Await pathology results.                           - Repeat colonoscopy is recommended. The                            colonoscopy date will be determined after pathology                            results from today's exam become available for                            review. Jerene Bears, MD 08/04/2017 2:22:38 PM This report has been signed electronically.

## 2017-08-04 NOTE — Progress Notes (Signed)
Called to room to assist during endoscopic procedure.  Patient ID and intended procedure confirmed with present staff. Received instructions for my participation in the procedure from the performing physician.  

## 2017-08-05 ENCOUNTER — Telehealth: Payer: Self-pay | Admitting: *Deleted

## 2017-08-05 NOTE — Telephone Encounter (Signed)
  Follow up Call-  Call back number 08/04/2017  Post procedure Call Back phone  # [phone]  Permission to leave phone message Yes  Some recent data might be hidden     Patient questions:  Do you have a fever, pain , or abdominal swelling? No. Pain Score  0 *  Have you tolerated food without any problems? Yes.    Have you been able to return to your normal activities? Yes.    Do you have any questions about your discharge instructions: Diet   No. Medications  No. Follow up visit  No.  Do you have questions or concerns about your Care? No.  Actions: * If pain score is 4 or above: No action needed, pain <4.

## 2017-08-10 ENCOUNTER — Other Ambulatory Visit: Payer: Self-pay

## 2017-08-10 ENCOUNTER — Encounter: Payer: Self-pay | Admitting: Physician Assistant

## 2017-08-10 ENCOUNTER — Encounter: Payer: Self-pay | Admitting: Internal Medicine

## 2017-08-10 ENCOUNTER — Ambulatory Visit (INDEPENDENT_AMBULATORY_CARE_PROVIDER_SITE_OTHER): Payer: Self-pay | Admitting: Physician Assistant

## 2017-08-10 VITALS — BP 140/90 | HR 93 | Temp 98.6°F | Resp 16 | Ht 61.0 in | Wt 125.2 lb

## 2017-08-10 DIAGNOSIS — B370 Candidal stomatitis: Secondary | ICD-10-CM

## 2017-08-10 DIAGNOSIS — G43009 Migraine without aura, not intractable, without status migrainosus: Secondary | ICD-10-CM

## 2017-08-10 DIAGNOSIS — I10 Essential (primary) hypertension: Secondary | ICD-10-CM

## 2017-08-10 DIAGNOSIS — Z7689 Persons encountering health services in other specified circumstances: Secondary | ICD-10-CM

## 2017-08-10 DIAGNOSIS — L659 Nonscarring hair loss, unspecified: Secondary | ICD-10-CM

## 2017-08-10 MED ORDER — NORTRIPTYLINE HCL 10 MG PO CAPS: 10.0000 mg | ORAL_CAPSULE | Freq: Every day | ORAL | 0 refills | Status: DC

## 2017-08-10 MED ORDER — HYDROCHLOROTHIAZIDE 12.5 MG PO CAPS: 12.5000 mg | ORAL_CAPSULE | Freq: Every day | ORAL | 0 refills | Status: DC

## 2017-08-10 MED ORDER — METHOCARBAMOL 500 MG PO TABS: 250.0000 mg | ORAL_TABLET | Freq: Every day | ORAL | 0 refills | Status: DC

## 2017-08-10 MED ORDER — FLUCONAZOLE 150 MG PO TABS: 150.0000 mg | ORAL_TABLET | Freq: Every day | ORAL | 0 refills | Status: DC

## 2017-08-10 NOTE — Progress Notes (Signed)
Name: Michele Kim   MRN: [id number]    DOB: 12-Jul-1976   Date:08/10/2017       Progress Note  Subjective  Chief Complaint  Chief Complaint  Patient presents with  . Establish Care    HPI Patient is here to establish care as a new patient.  Michele Kim is a 41 yr old female here today to establish with a PCP. She was previously followed by Einar Pheasant, MD with [place].   She has had ongoing bouts of thrush that require Magic mouthwash or nystatin mouthwash. Many times when she completes one round the thrush will come back. She is being followed by GI as well for this now. She has been seeing Dr. Hilarie Fredrickson with [facility].   She also was recently diagnosed with a stomach ulcer by Dr. Hilarie Fredrickson and is taking protonix.  Has a history of migraines and tension headaches also. Has seen Dr. Manuella Ghazi in the past, but this was back in 2015 when she last saw him.   She also has chronic hair loss. She has been seen by Dermatology without any true diagnosis. She has tried multiple treatment options, none that have worked well. She has thinning and loss over the top of the head, loss of eyebrows and eyelashes. Since losing her eyelashes she also complains of frequent eye irritation.   Decline Flu Vaccine today.  No problem-specific Assessment & Plan notes found for this encounter.   Past Medical History:  Diagnosis Date  . Frequent headaches    H/O  . Hair loss   . IBS (irritable bowel syndrome)   . Miscarriage    x 2  . Vitamin D deficiency     Past Surgical History:  Procedure Laterality Date  . COLONOSCOPY    . miscarriage  2014  . UPPER GASTROINTESTINAL ENDOSCOPY      Family History  Problem Relation Age of Onset  . Breast cancer Mother   . Hyperlipidemia Mother   . Hypertension Mother   . Heart disease Father   . Hypertension Father   . Lung cancer Maternal Uncle   . Heart disease Maternal Grandmother   . Hypertension Maternal Grandmother   . Diabetes Maternal  Grandmother   . Alcohol abuse Maternal Grandfather   . Stroke Maternal Grandfather   . Stroke Paternal Grandfather   . Alcohol abuse Paternal Grandfather   . Lung disease Paternal Grandfather   . Heart disease Paternal Grandfather   . Anxiety disorder Brother   . Breast cancer Maternal Aunt   . Heart disease Paternal Aunt   . Heart disease Paternal Aunt   . Heart disease Paternal Aunt   . Diabetes Maternal Aunt   . Colon cancer Neg Hx     Social History   Socioeconomic History  . Marital status: Married    Spouse name: Not on file  . Number of children: 1  . Years of education: Not on file  . Highest education level: Not on file  Social Needs  . Financial resource strain: Not on file  . Food insecurity - worry: Not on file  . Food insecurity - inability: Not on file  . Transportation needs - medical: Not on file  . Transportation needs - non-medical: Not on file  Occupational History  . Occupation: accounts payable  Tobacco Use  . Smoking status: Current Every Day Smoker    Packs/day: 1.00  . Smokeless tobacco: Never Used  Substance and Sexual Activity  . Alcohol use: Yes  Alcohol/week: 0.0 oz    Comment: occasional  . Drug use: No  . Sexual activity: Not on file  Other Topics Concern  . Not on file  Social History Narrative  . Not on file     Current Outpatient Medications:  .  pantoprazole (PROTONIX) 40 MG tablet, Take 1 tablet (40 mg total) by mouth daily., Disp: 90 tablet, Rfl: 3 .  promethazine (PHENERGAN) 25 MG tablet, Take 1 tablet (25 mg total) every 6 (six) hours as needed by mouth for nausea or vomiting., Disp: 30 tablet, Rfl: 0 .  DIPHENHYDRAMINE HCL PO, Take 10 mLs by mouth 4 (four) times daily as needed. Magic mouth wash, Disp: , Rfl:  .  fluconazole (DIFLUCAN) 150 MG tablet, Take 1 tablet (150 mg total) daily by mouth. (Patient not taking: Reported on 08/10/2017), Disp: 1 tablet, Rfl: 1 .  HYDROcodone-acetaminophen (NORCO/VICODIN) 5-325 MG tablet,  1-2 tabs po bid prn (Patient not taking: Reported on 08/10/2017), Disp: 6 tablet, Rfl: 0 .  magic mouthwash SOLN, Take 10 mLs 4 (four) times daily as needed by mouth. (Patient not taking: Reported on 08/10/2017), Disp: 1200 mL, Rfl: 0 .  ondansetron (ZOFRAN-ODT) 4 MG disintegrating tablet, Take 4 mg by mouth every 8 (eight) hours as needed for nausea or vomiting., Disp: , Rfl:   Current Facility-Administered Medications:  .  0.9 %  sodium chloride infusion, 500 mL, Intravenous, Continuous, Pyrtle, Lajuan Lines, MD  No Known Allergies   Review of Systems  Constitutional: Positive for diaphoresis.       Activity change, Appetite Change,Irritability  HENT: Positive for sinus pain (pressure).        Mouth sores/thrush  Eyes: Positive for discharge.  Respiratory: Negative.   Cardiovascular: Positive for leg swelling.  Gastrointestinal: Positive for vomiting.       Stomach Ulcer  Genitourinary: Negative.   Musculoskeletal: Positive for myalgias and neck pain (and neck stiffness).  Skin: Positive for rash.  Neurological: Positive for dizziness, focal weakness and headaches.       Light-headedness  Endo/Heme/Allergies: Negative.   Psychiatric/Behavioral: The patient has insomnia.    Objective  Vitals:   08/10/17 1430  BP: 140/90  Pulse: 93  Resp: 16  Temp: 98.6 F (37 C)  TempSrc: Oral  SpO2: 98%  Weight: 125 lb 3.2 oz (56.8 kg)  Height: 5\' 1"  (1.549 m)    Physical Exam  Constitutional: She is oriented to person, place, and time and well-developed, well-nourished, and in no distress.  HENT:  Head: Normocephalic and atraumatic. Hair is abnormal (thinning on top of head and not present on eyebrow or eyelashes).  Right Ear: Hearing, tympanic membrane, external ear and ear canal normal.  Left Ear: Hearing, tympanic membrane, external ear and ear canal normal.  Nose: Nose normal.  Mouth/Throat: Uvula is midline, oropharynx is clear and moist and mucous membranes are normal.    Eyes:  Conjunctivae and EOM are normal. Pupils are equal, round, and reactive to light.  Neck: Normal range of motion. Neck supple. No thyromegaly present.  Cardiovascular: Normal rate, regular rhythm, normal heart sounds and intact distal pulses.  No murmur heard. Pulmonary/Chest: Effort normal and breath sounds normal. No respiratory distress. She has no wheezes.  Abdominal: Soft. Bowel sounds are normal. There is no tenderness.  Musculoskeletal: Normal range of motion.  Neurological: She is alert and oriented to person, place, and time. She has normal reflexes. Gait normal. GCS score is 15.  Skin: Skin is warm and dry.  Psychiatric:  Mood, memory, affect and judgment normal.    Recent Results (from the past 2160 hour(s))  HIV antibody (with reflex)     Status: None   Collection Time: 06/22/17 10:09 AM  Result Value Ref Range   HIV 1&2 Ab, 4th Generation NON-REACTIVE NON-REACTI    Comment: HIV-1 antigen and HIV-1/HIV-2 antibodies were not detected. There is no laboratory evidence of HIV infection. Marland Kitchen PLEASE NOTE: This information has been disclosed to you from records whose confidentiality may be protected by state law.  If your state requires such protection, then the state law prohibits you from making any further disclosure of the information without the specific written consent of the person to whom it pertains, or as otherwise permitted by law. A general authorization for the release of medical or other information is NOT sufficient for this purpose. . For additional information please refer to http://education.[facility].com/faq/FAQ106 (This link is being provided for informational/ educational purposes only.) . Marland Kitchen The performance of this assay has not been clinically validated in patients less than 36 years old. .   TSH     Status: None   Collection Time: 06/22/17 10:09 AM  Result Value Ref Range   TSH 0.35 0.35 - 4.50 uIU/mL  CRP High sensitivity     Status: None    Collection Time: 06/22/17 10:09 AM  Result Value Ref Range   CRP, High Sensitivity 1.510 0.000 - 5.000 mg/L    Comment: Note:  An elevated hs-CRP (>5 mg/L) should be repeated after 2 weeks to rule out recent infection or trauma.  IgA     Status: None   Collection Time: 06/22/17 10:09 AM  Result Value Ref Range   IgA 202 68 - 378 mg/dL    Comment:    Tissue transglutaminase, IgA     Status: None   Collection Time: 06/22/17 10:09 AM  Result Value Ref Range   (tTG) Ab, IgA 1 U/mL    Comment: .        Value      Interpretation        -----      --------------        <4         No Antibody Detected        > or = 4   Antibody Detected .      Assessment & Plan  1. Establishing care with new doctor, encounter for Establishing from Dr. Nicki Reaper with [place].   2. Migraine without aura and without status migrainosus, not intractable She has been on this regimen in the past with Dr. Manuella Ghazi. Will try this again to see if she gets similar relief. I will see her back in 4 weeks to see if this regimen is working.  - nortriptyline (PAMELOR) 10 MG capsule; Take 1 capsule (10 mg total) by mouth at bedtime.  Dispense: 30 capsule; Refill: 0 - methocarbamol (ROBAXIN) 500 MG tablet; Take 0.5-1 tablets (250-500 mg total) by mouth at bedtime.  Dispense: 30 tablet; Refill: 0  3. Essential hypertension BP slightly elevated and has been through multiple visits recently. Will start HCTZ as below. I will see her back in 4 weeks to see how she is doing with this medication and recheck BP.  - hydrochlorothiazide (MICROZIDE) 12.5 MG capsule; Take 1 capsule (12.5 mg total) by mouth daily.  Dispense: 30 capsule; Refill: 0  4. Hair loss Chronic. Followed by dermatology.   5. Thrush No current sores, but has thick, white coating on tongue  still. Recently completed Nystatin mouthwash. Will do loading dose of diflucan as below. She is to call if ineffective.  - fluconazole (DIFLUCAN) 150 MG tablet; Take 1 tablet (150  mg total) by mouth daily.  Dispense: 3 tablet; Refill: 0

## 2017-08-10 NOTE — Patient Instructions (Signed)
Magnesium Oxide 400-500mg  daily for migraine prophylaxis  Nortriptyline capsules What is this medicine? NORTRIPTYLINE (nor TRIP ti leen) is used to treat depression. This medicine may be used for other purposes; ask your health care provider or pharmacist if you have questions. COMMON BRAND NAME(S): Aventyl, Pamelor What should I tell my health care provider before I take this medicine? They need to know if you have any of these conditions: -an alcohol problem -bipolar disorder or schizophrenia -difficulty passing urine, prostate trouble -glaucoma -heart disease or recent heart attack -liver disease -over active thyroid -seizures -thoughts or plans of suicide or a previous suicide attempt or family history of suicide attempt -an unusual or allergic reaction to nortriptyline, other medicines, foods, dyes, or preservatives -pregnant or trying to get pregnant -breast-feeding How should I use this medicine? Take this medicine by mouth with a glass of water. Follow the directions on the prescription label. Take your doses at regular intervals. Do not take it more often than directed. Do not stop taking this medicine suddenly except upon the advice of your doctor. Stopping this medicine too quickly may cause serious side effects or your condition may worsen. A special MedGuide will be given to you by the pharmacist with each prescription and refill. Be sure to read this information carefully each time. Talk to your pediatrician regarding the use of this medicine in children. Special care may be needed. Overdosage: If you think you have taken too much of this medicine contact a poison control center or emergency room at once. NOTE: This medicine is only for you. Do not share this medicine with others. What if I miss a dose? If you miss a dose, take it as soon as you can. If it is almost time for your next dose, take only that dose. Do not take double or extra doses. What may interact with this  medicine? Do not take this medicine with any of the following medications: -arsenic trioxide -certain medicines medicines for irregular heart beat -cisapride -halofantrine -linezolid -MAOIs like Carbex, Eldepryl, Marplan, Nardil, and Parnate -methylene blue (injected into a vein) -other medicines for mental depression -phenothiazines like perphenazine, thioridazine and chlorpromazine -pimozide -probucol -procarbazine -sparfloxacin -St. John's Wort -ziprasidone This medicine may also interact with any of the following medications: -atropine and related drugs like hyoscyamine, scopolamine, tolterodine and others -barbiturate medicines for inducing sleep or treating seizures, such as phenobarbital -cimetidine -medicines for diabetes -medicines for seizures like carbamazepine or phenytoin -reserpine -thyroid medicine This list may not describe all possible interactions. Give your health care provider a list of all the medicines, herbs, non-prescription drugs, or dietary supplements you use. Also tell them if you smoke, drink alcohol, or use illegal drugs. Some items may interact with your medicine. What should I watch for while using this medicine? Tell your doctor if your symptoms do not get better or if they get worse. Visit your doctor or health care professional for regular checks on your progress. Because it may take several weeks to see the full effects of this medicine, it is important to continue your treatment as prescribed by your doctor. Patients and their families should watch out for new or worsening thoughts of suicide or depression. Also watch out for sudden changes in feelings such as feeling anxious, agitated, panicky, irritable, hostile, aggressive, impulsive, severely restless, overly excited and hyperactive, or not being able to sleep. If this happens, especially at the beginning of treatment or after a change in dose, call your health care professional. You  may get  drowsy or dizzy. Do not drive, use machinery, or do anything that needs mental alertness until you know how this medicine affects you. Do not stand or sit up quickly, especially if you are an older patient. This reduces the risk of dizzy or fainting spells. Alcohol may interfere with the effect of this medicine. Avoid alcoholic drinks. Do not treat yourself for coughs, colds, or allergies without asking your doctor or health care professional for advice. Some ingredients can increase possible side effects. Your mouth may get dry. Chewing sugarless gum or sucking hard candy, and drinking plenty of water may help. Contact your doctor if the problem does not go away or is severe. This medicine may cause dry eyes and blurred vision. If you wear contact lenses you may feel some discomfort. Lubricating drops may help. See your eye doctor if the problem does not go away or is severe. This medicine can cause constipation. Try to have a bowel movement at least every 2 to 3 days. If you do not have a bowel movement for 3 days, call your doctor or health care professional. This medicine can make you more sensitive to the sun. Keep out of the sun. If you cannot avoid being in the sun, wear protective clothing and use sunscreen. Do not use sun lamps or tanning beds/booths. What side effects may I notice from receiving this medicine? Side effects that you should report to your doctor or health care professional as soon as possible: -allergic reactions like skin rash, itching or hives, swelling of the face, lips, or tongue -anxious -breathing problems -changes in vision -confusion -elevated mood, decreased need for sleep, racing thoughts, impulsive behavior -eye pain -fast, irregular heartbeat -feeling faint or lightheaded, falls -feeling agitated, angry, or irritable -fever with increased sweating -hallucination, loss of contact with reality -seizures -stiff muscles -suicidal thoughts or other mood  changes -tingling, pain, or numbness in the feet or hands -trouble passing urine or change in the amount of urine -trouble sleeping -unusually weak or tired -vomiting -yellowing of the eyes or skin Side effects that usually do not require medical attention (report to your doctor or health care professional if they continue or are bothersome): -change in sex drive or performance -change in appetite or weight -constipation -dizziness -dry mouth -nausea -tired -tremors -upset stomach This list may not describe all possible side effects. Call your doctor for medical advice about side effects. You may report side effects to FDA at 1-800-FDA-1088. Where should I keep my medicine? Keep out of the reach of children. Store at room temperature between 15 and 30 degrees C (59 and 86 degrees F). Keep container tightly closed. Throw away any unused medicine after the expiration date. NOTE: This sheet is a summary. It may not cover all possible information. If you have questions about this medicine, talk to your doctor, pharmacist, or health care provider.  2018 [employer] Standard (2016-01-23 12:53:08)  Methocarbamol tablets What is this medicine? METHOCARBAMOL (meth oh KAR ba mole) helps to relieve pain and stiffness in muscles caused by strains, sprains, or other injury to your muscles. This medicine may be used for other purposes; ask your health care provider or pharmacist if you have questions. COMMON BRAND NAME(S): Robaxin What should I tell my health care provider before I take this medicine? They need to know if you have any of these conditions: -kidney disease -seizures -an unusual or allergic reaction to methocarbamol, other medicines, foods, dyes, or preservatives -pregnant or trying to get  pregnant -breast-feeding How should I use this medicine? Take this medicine by mouth with a full glass of water. Follow the directions on the prescription label. Take your medicine at  regular intervals. Do not take your medicine more often than directed. Talk to your pediatrician regarding the use of this medicine in children. Special care may be needed. Overdosage: If you think you have taken too much of this medicine contact a poison control center or emergency room at once. NOTE: This medicine is only for you. Do not share this medicine with others. What if I miss a dose? If you miss a dose, take it as soon as you can. If it is almost time for your next dose, take only the next dose. Do not take double or extra doses. What may interact with this medicine? Do not take this medication with any of the following medicines: -narcotic medicines for cough This medicine may also interact with the following medications: -alcohol -antihistamines for allergy, cough and cold -certain medicines for anxiety or sleep -certain medicines for depression like amitriptyline, fluoxetine, sertraline -certain medicines for seizures like phenobarbital, primidone -cholinesterase inhibitors like neostigmine, ambenonium, and pyridostigmine bromide -general anesthetics like halothane, isoflurane, methoxyflurane, propofol -local anesthetics like lidocaine, pramoxine, tetracaine -medicines that relax muscles for surgery -narcotic medicines for pain -phenothiazines like chlorpromazine, mesoridazine, prochlorperazine, thioridazine This list may not describe all possible interactions. Give your health care provider a list of all the medicines, herbs, non-prescription drugs, or dietary supplements you use. Also tell them if you smoke, drink alcohol, or use illegal drugs. Some items may interact with your medicine. What should I watch for while using this medicine? Tell your doctor or health care professional if your symptoms do not start to get better or if they get worse. You may get drowsy or dizzy. Do not drive, use machinery, or do anything that needs mental alertness until you know how this  medicine affects you. Do not stand or sit up quickly, especially if you are an older patient. This reduces the risk of dizzy or fainting spells. Alcohol may interfere with the effect of this medicine. Avoid alcoholic drinks. If you are taking another medicine that also causes drowsiness, you may have more side effects. Give your health care provider a list of all medicines you use. Your doctor will tell you how much medicine to take. Do not take more medicine than directed. Call emergency for help if you have problems breathing or unusual sleepiness. What side effects may I notice from receiving this medicine? Side effects that you should report to your doctor or health care professional as soon as possible: -allergic reactions like skin rash, itching or hives, swelling of the face, lips, or tongue -breathing problems -confusion -seizures -unusually weak or tired Side effects that usually do not require medical attention (report to your doctor or health care professional if they continue or are bothersome): -dizziness -headache -metallic taste -tiredness -upset stomach This list may not describe all possible side effects. Call your doctor for medical advice about side effects. You may report side effects to FDA at 1-800-FDA-1088. Where should I keep my medicine? Keep out of the reach of children. Store at room temperature between 20 and 25 degrees C (68 and 77 degrees F). Keep container tightly closed. Throw away any unused medicine after the expiration date. NOTE: This sheet is a summary. It may not cover all possible information. If you have questions about this medicine, talk to your doctor, pharmacist, or health care  provider.  2018 [employer] Standard (2015-06-03 13:11:54)  Hydrochlorothiazide, HCTZ capsules or tablets What is this medicine? HYDROCHLOROTHIAZIDE (hye droe klor oh THYE a zide) is a diuretic. It increases the amount of urine passed, which causes the body to lose salt  and water. This medicine is used to treat high blood pressure. It is also reduces the swelling and water retention caused by various medical conditions, such as heart, liver, or kidney disease. This medicine may be used for other purposes; ask your health care provider or pharmacist if you have questions. COMMON BRAND NAME(S): Esidrix, Ezide, HydroDIURIL, Microzide, Oretic, Zide What should I tell my health care provider before I take this medicine? They need to know if you have any of these conditions: -diabetes -gout -immune system problems, like lupus -kidney disease or kidney stones -liver disease -pancreatitis -small amount of urine or difficulty passing urine -an unusual or allergic reaction to hydrochlorothiazide, sulfa drugs, other medicines, foods, dyes, or preservatives -pregnant or trying to get pregnant -breast-feeding How should I use this medicine? Take this medicine by mouth with a glass of water. Follow the directions on the prescription label. Take your medicine at regular intervals. Remember that you will need to pass urine frequently after taking this medicine. Do not take your doses at a time of day that will cause you problems. Do not stop taking your medicine unless your doctor tells you to. Talk to your pediatrician regarding the use of this medicine in children. Special care may be needed. Overdosage: If you think you have taken too much of this medicine contact a poison control center or emergency room at once. NOTE: This medicine is only for you. Do not share this medicine with others. What if I miss a dose? If you miss a dose, take it as soon as you can. If it is almost time for your next dose, take only that dose. Do not take double or extra doses. What may interact with this medicine? -cholestyramine -colestipol -digoxin -dofetilide -lithium -medicines for blood pressure -medicines for diabetes -medicines that relax muscles for surgery -other  diuretics -steroid medicines like prednisone or cortisone This list may not describe all possible interactions. Give your health care provider a list of all the medicines, herbs, non-prescription drugs, or dietary supplements you use. Also tell them if you smoke, drink alcohol, or use illegal drugs. Some items may interact with your medicine. What should I watch for while using this medicine? Visit your doctor or health care professional for regular checks on your progress. Check your blood pressure as directed. Ask your doctor or health care professional what your blood pressure should be and when you should contact him or her. You may need to be on a special diet while taking this medicine. Ask your doctor. Check with your doctor or health care professional if you get an attack of severe diarrhea, nausea and vomiting, or if you sweat a lot. The loss of too much body fluid can make it dangerous for you to take this medicine. You may get drowsy or dizzy. Do not drive, use machinery, or do anything that needs mental alertness until you know how this medicine affects you. Do not stand or sit up quickly, especially if you are an older patient. This reduces the risk of dizzy or fainting spells. Alcohol may interfere with the effect of this medicine. Avoid alcoholic drinks. This medicine may affect your blood sugar level. If you have diabetes, check with your doctor or health care professional before  changing the dose of your diabetic medicine. This medicine can make you more sensitive to the sun. Keep out of the sun. If you cannot avoid being in the sun, wear protective clothing and use sunscreen. Do not use sun lamps or tanning beds/booths. What side effects may I notice from receiving this medicine? Side effects that you should report to your doctor or health care professional as soon as possible: -allergic reactions such as skin rash or itching, hives, swelling of the lips, mouth, tongue, or  throat -changes in vision -chest pain -eye pain -fast or irregular heartbeat -feeling faint or lightheaded, falls -gout attack -muscle pain or cramps -pain or difficulty when passing urine -pain, tingling, numbness in the hands or feet -redness, blistering, peeling or loosening of the skin, including inside the mouth -unusually weak or tired Side effects that usually do not require medical attention (report to your doctor or health care professional if they continue or are bothersome): -change in sex drive or performance -dry mouth -headache -stomach upset This list may not describe all possible side effects. Call your doctor for medical advice about side effects. You may report side effects to FDA at 1-800-FDA-1088. Where should I keep my medicine? Keep out of the reach of children. Store at room temperature between 15 and 30 degrees C (59 and 86 degrees F). Do not freeze. Protect from light and moisture. Keep container closed tightly. Throw away any unused medicine after the expiration date. NOTE: This sheet is a summary. It may not cover all possible information. If you have questions about this medicine, talk to your doctor, pharmacist, or health care provider.  2018 [employer] Standard (2010-04-17 12:57:37)

## 2017-09-07 ENCOUNTER — Ambulatory Visit (INDEPENDENT_AMBULATORY_CARE_PROVIDER_SITE_OTHER): Payer: Self-pay | Admitting: Physician Assistant

## 2017-09-07 ENCOUNTER — Encounter: Payer: Self-pay | Admitting: Physician Assistant

## 2017-09-07 VITALS — BP 120/80 | HR 98 | Temp 98.2°F | Resp 16 | Ht 61.0 in | Wt 125.8 lb

## 2017-09-07 DIAGNOSIS — G43009 Migraine without aura, not intractable, without status migrainosus: Secondary | ICD-10-CM

## 2017-09-07 DIAGNOSIS — B370 Candidal stomatitis: Secondary | ICD-10-CM

## 2017-09-07 DIAGNOSIS — I10 Essential (primary) hypertension: Secondary | ICD-10-CM

## 2017-09-07 MED ORDER — HYDROCHLOROTHIAZIDE 12.5 MG PO CAPS: 12.5000 mg | ORAL_CAPSULE | Freq: Every day | ORAL | 3 refills | Status: DC

## 2017-09-07 MED ORDER — NORTRIPTYLINE HCL 10 MG PO CAPS: 10.0000 mg | ORAL_CAPSULE | Freq: Every day | ORAL | 3 refills | Status: DC

## 2017-09-07 MED ORDER — METHOCARBAMOL 500 MG PO TABS: 250.0000 mg | ORAL_TABLET | Freq: Every day | ORAL | 1 refills | Status: DC

## 2017-09-07 NOTE — Patient Instructions (Signed)
DASH Eating Plan DASH stands for "Dietary Approaches to Stop Hypertension." The DASH eating plan is a healthy eating plan that has been shown to reduce high blood pressure (hypertension). It may also reduce your risk for type 2 diabetes, heart disease, and stroke. The DASH eating plan may also help with weight loss. What are tips for following this plan? General guidelines  Avoid eating more than 2,300 mg (milligrams) of salt (sodium) a day. If you have hypertension, you may need to reduce your sodium intake to 1,500 mg a day.  Limit alcohol intake to no more than 1 drink a day for nonpregnant women and 2 drinks a day for men. One drink equals 12 oz of beer, 5 oz of wine, or 1 oz of hard liquor.  Work with your health care provider to maintain a healthy body weight or to lose weight. Ask what an ideal weight is for you.  Get at least 30 minutes of exercise that causes your heart to beat faster (aerobic exercise) most days of the week. Activities may include walking, swimming, or biking.  Work with your health care provider or diet and nutrition specialist (dietitian) to adjust your eating plan to your individual calorie needs. Reading food labels  Check food labels for the amount of sodium per serving. Choose foods with less than 5 percent of the Daily Value of sodium. Generally, foods with less than 300 mg of sodium per serving fit into this eating plan.  To find whole grains, look for the word "whole" as the first word in the ingredient list. Shopping  Buy products labeled as "low-sodium" or "no salt added."  Buy fresh foods. Avoid canned foods and premade or frozen meals. Cooking  Avoid adding salt when cooking. Use salt-free seasonings or herbs instead of table salt or sea salt. Check with your health care provider or pharmacist before using salt substitutes.  Do not fry foods. Cook foods using healthy methods such as baking, boiling, grilling, and broiling instead.  Cook with  heart-healthy oils, such as olive, canola, soybean, or sunflower oil. Meal planning   Eat a balanced diet that includes: ? 5 or more servings of fruits and vegetables each day. At each meal, try to fill half of your plate with fruits and vegetables. ? Up to 6-8 servings of whole grains each day. ? Less than 6 oz of lean meat, poultry, or fish each day. A 3-oz serving of meat is about the same size as a deck of cards. One egg equals 1 oz. ? 2 servings of low-fat dairy each day. ? A serving of nuts, seeds, or beans 5 times each week. ? Heart-healthy fats. Healthy fats called Omega-3 fatty acids are found in foods such as flaxseeds and coldwater fish, like sardines, salmon, and mackerel.  Limit how much you eat of the following: ? Canned or prepackaged foods. ? Food that is high in trans fat, such as fried foods. ? Food that is high in saturated fat, such as fatty meat. ? Sweets, desserts, sugary drinks, and other foods with added sugar. ? Full-fat dairy products.  Do not salt foods before eating.  Try to eat at least 2 vegetarian meals each week.  Eat more home-cooked food and less restaurant, buffet, and fast food.  When eating at a restaurant, ask that your food be prepared with less salt or no salt, if possible. What foods are recommended? The items listed may not be a complete list. Talk with your dietitian about what   dietary choices are best for you. Grains Whole-grain or whole-wheat bread. Whole-grain or whole-wheat pasta. Brown rice. Oatmeal. Quinoa. Bulgur. Whole-grain and low-sodium cereals. Pita bread. Low-fat, low-sodium crackers. Whole-wheat flour tortillas. Vegetables Fresh or frozen vegetables (raw, steamed, roasted, or grilled). Low-sodium or reduced-sodium tomato and vegetable juice. Low-sodium or reduced-sodium tomato sauce and tomato paste. Low-sodium or reduced-sodium canned vegetables. Fruits All fresh, dried, or frozen fruit. Canned fruit in natural juice (without  added sugar). Meat and other protein foods Skinless chicken or turkey. Ground chicken or turkey. Pork with fat trimmed off. Fish and seafood. Egg whites. Dried beans, peas, or lentils. Unsalted nuts, nut butters, and seeds. Unsalted canned beans. Lean cuts of beef with fat trimmed off. Low-sodium, lean deli meat. Dairy Low-fat (1%) or fat-free (skim) milk. Fat-free, low-fat, or reduced-fat cheeses. Nonfat, low-sodium ricotta or cottage cheese. Low-fat or nonfat yogurt. Low-fat, low-sodium cheese. Fats and oils Soft margarine without trans fats. Vegetable oil. Low-fat, reduced-fat, or light mayonnaise and salad dressings (reduced-sodium). Canola, safflower, olive, soybean, and sunflower oils. Avocado. Seasoning and other foods Herbs. Spices. Seasoning mixes without salt. Unsalted popcorn and pretzels. Fat-free sweets. What foods are not recommended? The items listed may not be a complete list. Talk with your dietitian about what dietary choices are best for you. Grains Baked goods made with fat, such as croissants, muffins, or some breads. Dry pasta or rice meal packs. Vegetables Creamed or fried vegetables. Vegetables in a cheese sauce. Regular canned vegetables (not low-sodium or reduced-sodium). Regular canned tomato sauce and paste (not low-sodium or reduced-sodium). Regular tomato and vegetable juice (not low-sodium or reduced-sodium). Pickles. Olives. Fruits Canned fruit in a light or heavy syrup. Fried fruit. Fruit in cream or butter sauce. Meat and other protein foods Fatty cuts of meat. Ribs. Fried meat. Bacon. Sausage. Bologna and other processed lunch meats. Salami. Fatback. Hotdogs. Bratwurst. Salted nuts and seeds. Canned beans with added salt. Canned or smoked fish. Whole eggs or egg yolks. Chicken or turkey with skin. Dairy Whole or 2% milk, cream, and half-and-half. Whole or full-fat cream cheese. Whole-fat or sweetened yogurt. Full-fat cheese. Nondairy creamers. Whipped toppings.  Processed cheese and cheese spreads. Fats and oils Butter. Stick margarine. Lard. Shortening. Ghee. Bacon fat. Tropical oils, such as coconut, palm kernel, or palm oil. Seasoning and other foods Salted popcorn and pretzels. Onion salt, garlic salt, seasoned salt, table salt, and sea salt. Worcestershire sauce. Tartar sauce. Barbecue sauce. Teriyaki sauce. Soy sauce, including reduced-sodium. Steak sauce. Canned and packaged gravies. Fish sauce. Oyster sauce. Cocktail sauce. Horseradish that you find on the shelf. Ketchup. Mustard. Meat flavorings and tenderizers. Bouillon cubes. Hot sauce and Tabasco sauce. Premade or packaged marinades. Premade or packaged taco seasonings. Relishes. Regular salad dressings. Where to find more information:  National Heart, Lung, and Blood Institute: www.nhlbi.nih.gov  American Heart Association: www.heart.org Summary  The DASH eating plan is a healthy eating plan that has been shown to reduce high blood pressure (hypertension). It may also reduce your risk for type 2 diabetes, heart disease, and stroke.  With the DASH eating plan, you should limit salt (sodium) intake to 2,300 mg a day. If you have hypertension, you may need to reduce your sodium intake to 1,500 mg a day.  When on the DASH eating plan, aim to eat more fresh fruits and vegetables, whole grains, lean proteins, low-fat dairy, and heart-healthy fats.  Work with your health care provider or diet and nutrition specialist (dietitian) to adjust your eating plan to your individual   calorie needs. This information is not intended to replace advice given to you by your health care provider. Make sure you discuss any questions you have with your health care provider. Document Released: 08/12/2011 Document Revised: 08/16/2016 Document Reviewed: 08/16/2016 Elsevier Interactive Patient Education  2018 Elsevier Inc.  

## 2017-09-07 NOTE — Progress Notes (Signed)
Patient: Michele Kim Female    DOB: 07/05/1976   42 y.o.   MRN: [id number] Visit Date: 09/07/2017  Today's Provider: Mar Daring, PA-C   Chief Complaint  Patient presents with  . Hypertension   Subjective:    HPI  Hypertension, follow-up:  BP Readings from Last 3 Encounters:  09/07/17 120/80  08/10/17 140/90  08/04/17 (!) 147/89   She was last seen for hypertension 4 weeks ago.  BP at that visit was 140/90. Management changes since that visit include start HCTZ. She reports excellent compliance with treatment. She is not having side effects.  She is exercising. She is adherent to low salt diet.   Outside blood pressures are not being checked. She is experiencing lower extremity edema.  Patient denies chest pain.   Cardiovascular risk factors include hypertension and smoking/ tobacco exposure.  Use of agents associated with hypertension: decongestants. Sudafed over the weekend   Weight trend: stable Wt Readings from Last 3 Encounters:  09/07/17 125 lb 12.8 oz (57.1 kg)  08/10/17 125 lb 3.2 oz (56.8 kg)  08/04/17 124 lb (56.2 kg)   Current diet: in general, a "healthy" diet   ------------------------------------------------------------------------   Follow up for Migraines  The patient was last seen for this 4 weeks ago. Changes made at last visit include start Pamelor and Robaxin.  She reports excellent compliance with treatment. She feels that condition is Improved. She is not having side effects.  ------------------------------------------------------------------------------------    No Known Allergies   Current Outpatient Medications:  .  DIPHENHYDRAMINE HCL PO, Take 10 mLs by mouth 4 (four) times daily as needed. Magic mouth wash, Disp: , Rfl:  .  fluconazole (DIFLUCAN) 150 MG tablet, Take 1 tablet (150 mg total) by mouth daily., Disp: 3 tablet, Rfl: 0 .  hydrochlorothiazide (MICROZIDE) 12.5 MG capsule, Take 1 capsule (12.5 mg total)  by mouth daily., Disp: 30 capsule, Rfl: 0 .  methocarbamol (ROBAXIN) 500 MG tablet, Take 0.5-1 tablets (250-500 mg total) by mouth at bedtime., Disp: 30 tablet, Rfl: 0 .  nortriptyline (PAMELOR) 10 MG capsule, Take 1 capsule (10 mg total) by mouth at bedtime., Disp: 30 capsule, Rfl: 0 .  pantoprazole (PROTONIX) 40 MG tablet, Take 1 tablet (40 mg total) by mouth daily., Disp: 90 tablet, Rfl: 3 .  promethazine (PHENERGAN) 25 MG tablet, Take 1 tablet (25 mg total) every 6 (six) hours as needed by mouth for nausea or vomiting., Disp: 30 tablet, Rfl: 0  Current Facility-Administered Medications:  .  0.9 %  sodium chloride infusion, 500 mL, Intravenous, Continuous, Pyrtle, Lajuan Lines, MD  Review of Systems  Constitutional: Negative.   Respiratory: Negative.   Cardiovascular: Positive for leg swelling.  Gastrointestinal: Negative.   Endocrine: Negative.   Genitourinary: Negative.   Neurological: Positive for headaches.    Social History   Tobacco Use  . Smoking status: Current Every Day Smoker    Packs/day: 1.00  . Smokeless tobacco: Never Used  Substance Use Topics  . Alcohol use: Yes    Alcohol/week: 0.0 oz    Comment: occasional   Objective:   BP 120/80 (BP Location: Left Arm, Patient Position: Sitting, Cuff Size: Normal)   Pulse 98   Temp 98.2 F (36.8 C)   Resp 16   Ht 5\' 1"  (1.549 m)   Wt 125 lb 12.8 oz (57.1 kg)   SpO2 97%   BMI 23.77 kg/m  Vitals:   09/07/17 0828  BP: 120/80  Pulse: 98  Resp: 16  Temp: 98.2 F (36.8 C)  SpO2: 97%  Weight: 125 lb 12.8 oz (57.1 kg)  Height: 5\' 1"  (1.549 m)     Physical Exam  Constitutional: She appears well-developed and well-nourished. No distress.  Neck: Normal range of motion. Neck supple. No tracheal deviation present. No thyromegaly present.  Cardiovascular: Normal rate, regular rhythm and normal heart sounds. Exam reveals no gallop and no friction rub.  No murmur heard. Pulmonary/Chest: Effort normal and breath sounds  normal. No respiratory distress. She has no wheezes. She has no rales.  Musculoskeletal: She exhibits no edema.  Lymphadenopathy:    She has no cervical adenopathy.  Skin: She is not diaphoretic.  Vitals reviewed.      Assessment & Plan:     1. Essential hypertension Improved.Continue current medical treatment plan with HCTZ 12.5mg  daily. She will return in 1-3 months for her CPE. - hydrochlorothiazide (MICROZIDE) 12.5 MG capsule; Take 1 capsule (12.5 mg total) by mouth daily.  Dispense: 90 capsule; Refill: 3  2. Migraine without aura and without status migrainosus, not intractable Improved. Only one migraine requiring her to take [facility] powder. This is much improved as she was taking almost daily. Continue current medical treatment plan.  - methocarbamol (ROBAXIN) 500 MG tablet; Take 0.5-1 tablets (250-500 mg total) by mouth at bedtime.  Dispense: 90 tablet; Refill: 1 - nortriptyline (PAMELOR) 10 MG capsule; Take 1 capsule (10 mg total) by mouth at bedtime.  Dispense: 90 capsule; Refill: 3  3. Thrush Improved currently. No nausea. Still some discoloration but no pain. She is to call if symptoms worsen again and will try a 14 day ketoconazole.        Michele Daring, PA-C  [facility]

## 2017-09-19 ENCOUNTER — Encounter: Payer: Self-pay | Admitting: *Deleted

## 2017-09-30 ENCOUNTER — Ambulatory Visit: Payer: Self-pay | Admitting: Internal Medicine

## 2017-11-15 ENCOUNTER — Encounter: Payer: Self-pay | Admitting: Physician Assistant

## 2017-11-15 ENCOUNTER — Ambulatory Visit (INDEPENDENT_AMBULATORY_CARE_PROVIDER_SITE_OTHER): Payer: BLUE CROSS/BLUE SHIELD | Admitting: Physician Assistant

## 2017-11-15 VITALS — BP 120/70 | HR 84 | Temp 98.2°F | Resp 16 | Ht 61.0 in | Wt 126.6 lb

## 2017-11-15 DIAGNOSIS — Z833 Family history of diabetes mellitus: Secondary | ICD-10-CM | POA: Diagnosis not present

## 2017-11-15 DIAGNOSIS — I10 Essential (primary) hypertension: Secondary | ICD-10-CM | POA: Diagnosis not present

## 2017-11-15 DIAGNOSIS — Z13220 Encounter for screening for lipoid disorders: Secondary | ICD-10-CM

## 2017-11-15 DIAGNOSIS — Z136 Encounter for screening for cardiovascular disorders: Secondary | ICD-10-CM

## 2017-11-15 DIAGNOSIS — Z1231 Encounter for screening mammogram for malignant neoplasm of breast: Secondary | ICD-10-CM

## 2017-11-15 DIAGNOSIS — Z124 Encounter for screening for malignant neoplasm of cervix: Secondary | ICD-10-CM | POA: Diagnosis not present

## 2017-11-15 DIAGNOSIS — Z0000 Encounter for general adult medical examination without abnormal findings: Secondary | ICD-10-CM

## 2017-11-15 DIAGNOSIS — R87810 Cervical high risk human papillomavirus (HPV) DNA test positive: Secondary | ICD-10-CM

## 2017-11-15 NOTE — Progress Notes (Signed)
Patient: Michele Kim, Female    DOB: 08/06/1976, 42 y.o.   MRN: [id number] Visit Date: 11/15/2017  Today's Provider: Mar Daring, PA-C   Chief Complaint  Patient presents with  . Annual Exam   Subjective:    Annual physical exam Michele Kim is a 42 y.o. female who presents today for health maintenance and complete physical. She feels fairly well. She reports exercising some. She reports she is sleeping fairly well.  Pap:07/19/14-normal, HPV-Pos. Recheck pap in 6 months. -----------------------------------------------------------------   Review of Systems  Constitutional: Positive for diaphoresis.  HENT: Positive for mouth sores and sinus pressure.   Eyes: Positive for discharge.  Respiratory: Negative.   Cardiovascular: Negative.   Gastrointestinal: Negative.   Endocrine: Negative.   Genitourinary: Negative.   Musculoskeletal: Negative.   Skin: Negative.   Allergic/Immunologic: Negative.   Neurological: Positive for light-headedness and headaches.  Hematological: Negative.   Psychiatric/Behavioral: Negative.     Social History      She  reports that she has been smoking.  She has been smoking about 1.00 pack per day. she has never used smokeless tobacco. She reports that she drinks alcohol. She reports that she does not use drugs.       Social History   Socioeconomic History  . Marital status: Married    Spouse name: None  . Number of children: 1  . Years of education: None  . Highest education level: None  Social Needs  . Financial resource strain: None  . Food insecurity - worry: None  . Food insecurity - inability: None  . Transportation needs - medical: None  . Transportation needs - non-medical: None  Occupational History  . Occupation: accounts payable  Tobacco Use  . Smoking status: Current Every Day Smoker    Packs/day: 1.00  . Smokeless tobacco: Never Used  Substance and Sexual Activity  . Alcohol use: Yes    Alcohol/week:  0.0 oz    Comment: occasional  . Drug use: No  . Sexual activity: None  Other Topics Concern  . None  Social History Narrative  . None    Past Medical History:  Diagnosis Date  . Diverticulosis   . Frequent headaches    H/O  . Gastric ulcer   . Gastritis   . Hair loss   . Hyperplastic colon polyp   . IBS (irritable bowel syndrome)   . Miscarriage    x 2  . Vitamin D deficiency      Patient Active Problem List   Diagnosis Date Noted  . Migraine without aura and without status migrainosus, not intractable 08/10/2017  . Essential hypertension 08/10/2017  . Hair loss 02/05/2014  . Miscarriage 02/05/2014  . Vitamin D deficiency 02/05/2014  . Stress 02/05/2014    Past Surgical History:  Procedure Laterality Date  . COLONOSCOPY    . miscarriage  2014  . UPPER GASTROINTESTINAL ENDOSCOPY      Family History        Family Status  Relation Name Status  . Mother  Alive  . Father  Alive  . Mat Uncle  (Not Specified)  . MGM  Deceased  . MGF  Deceased  . PGF  Deceased  . Brother  Alive  . PGM  Deceased  . Mat [employer]  . [employer]  . Ethlyn Daniels  Deceased  . [employer]  . Mat [employer]  . Neg Hx  (  Not Specified)        Her family history includes Alcohol abuse in her maternal grandfather and paternal grandfather; Anxiety disorder in her brother; Breast cancer in her maternal aunt and mother; Diabetes in her maternal aunt and maternal grandmother; Heart disease in her father, maternal grandmother, paternal aunt, paternal aunt, paternal aunt, and paternal grandfather; Hyperlipidemia in her mother; Hypertension in her father, maternal grandmother, and mother; Lung cancer in her maternal uncle; Lung disease in her paternal grandfather; Stroke in her maternal grandfather and paternal grandfather. There is no history of Colon cancer.      No Known Allergies   Current Outpatient Medications:  .  hydrochlorothiazide (MICROZIDE) 12.5 MG capsule, Take 1  capsule (12.5 mg total) by mouth daily., Disp: 90 capsule, Rfl: 3 .  methocarbamol (ROBAXIN) 500 MG tablet, Take 0.5-1 tablets (250-500 mg total) by mouth at bedtime., Disp: 90 tablet, Rfl: 1 .  nortriptyline (PAMELOR) 10 MG capsule, Take 1 capsule (10 mg total) by mouth at bedtime., Disp: 90 capsule, Rfl: 3 .  pantoprazole (PROTONIX) 40 MG tablet, Take 1 tablet (40 mg total) by mouth daily., Disp: 90 tablet, Rfl: 3 .  DIPHENHYDRAMINE HCL PO, Take 10 mLs by mouth 4 (four) times daily as needed. Magic mouth wash, Disp: , Rfl:  .  fluconazole (DIFLUCAN) 150 MG tablet, Take 1 tablet (150 mg total) by mouth daily. (Patient not taking: Reported on 11/15/2017), Disp: 3 tablet, Rfl: 0 .  promethazine (PHENERGAN) 25 MG tablet, Take 1 tablet (25 mg total) every 6 (six) hours as needed by mouth for nausea or vomiting. (Patient not taking: Reported on 11/15/2017), Disp: 30 tablet, Rfl: 0  Current Facility-Administered Medications:  .  0.9 %  sodium chloride infusion, 500 mL, Intravenous, Continuous, Pyrtle, Lajuan Lines, MD   Patient Care Team: Mar Daring, PA-C as PCP - General (Family Medicine)      Objective:   Vitals: BP 120/70 (BP Location: Left Arm, Patient Position: Sitting, Cuff Size: Normal)   Pulse 84   Temp 98.2 F (36.8 C) (Oral)   Resp 16   Ht 5\' 1"  (1.549 m)   Wt 126 lb 9.6 oz (57.4 kg)   LMP  (Approximate) Comment: 3 weeks ago  BMI 23.92 kg/m     Physical Exam  Constitutional: She is oriented to person, place, and time. She appears well-developed and well-nourished. No distress.  HENT:  Head: Normocephalic and atraumatic.  Right Ear: Hearing, tympanic membrane, external ear and ear canal normal.  Left Ear: Hearing, tympanic membrane, external ear and ear canal normal.  Nose: Nose normal.  Mouth/Throat: Uvula is midline, oropharynx is clear and moist and mucous membranes are normal. No oropharyngeal exudate.  Eyes: Conjunctivae and EOM are normal. Pupils are equal, round, and  reactive to light. Right eye exhibits no discharge. Left eye exhibits no discharge. No scleral icterus.  Neck: Normal range of motion. Neck supple. No JVD present. Carotid bruit is not present. No tracheal deviation present. No thyromegaly present.  Cardiovascular: Normal rate, regular rhythm, normal heart sounds and intact distal pulses. Exam reveals no gallop and no friction rub.  No murmur heard. Pulmonary/Chest: Effort normal and breath sounds normal. No respiratory distress. She has no wheezes. She has no rales. She exhibits no tenderness. Right breast exhibits no inverted nipple, no mass, no nipple discharge, no skin change and no tenderness. Left breast exhibits no inverted nipple, no mass, no nipple discharge, no skin change and no tenderness. Breasts are symmetrical.  Abdominal: Soft. Bowel sounds are normal. She exhibits no distension and no mass. There is no tenderness. There is no rebound and no guarding. Hernia confirmed negative in the right inguinal area and confirmed negative in the left inguinal area.  Genitourinary: Rectum normal, vagina normal and uterus normal. No breast swelling, tenderness, discharge or bleeding. Pelvic exam was performed with patient supine. There is no rash, tenderness, lesion or injury on the right labia. There is no rash, tenderness, lesion or injury on the left labia. Cervix exhibits no motion tenderness, no discharge and no friability. Right adnexum displays no mass, no tenderness and no fullness. Left adnexum displays no mass, no tenderness and no fullness. No erythema, tenderness or bleeding in the vagina. No signs of injury around the vagina. No vaginal discharge found.  Musculoskeletal: Normal range of motion. She exhibits no edema or tenderness.  Lymphadenopathy:    She has no cervical adenopathy.       Right: No inguinal adenopathy present.       Left: No inguinal adenopathy present.  Neurological: She is alert and oriented to person, place, and time.  She has normal reflexes. No cranial nerve deficit. Coordination normal.  Skin: Skin is warm and dry. No rash noted. She is not diaphoretic.  Psychiatric: She has a normal mood and affect. Her behavior is normal. Judgment and thought content normal.  Vitals reviewed.    Depression Screen PHQ 2/9 Scores 11/15/2017 08/10/2017  PHQ - 2 Score 2 2  PHQ- 9 Score 8 11      Assessment & Plan:     Routine Health Maintenance and Physical Exam  Exercise Activities and Dietary recommendations Goals    None      Immunization History  Administered Date(s) Administered  . Influenza,inj,Quad PF,6+ Mos 07/19/2014    Health Maintenance  Topic Date Due  . TETANUS/TDAP  03/31/1994  . PAP SMEAR  07/19/2017  . INFLUENZA VACCINE  12/04/2017 (Originally 04/06/2017)  . HIV Screening  Completed     Discussed health benefits of physical activity, and encouraged her to engage in regular exercise appropriate for her age and condition.    1. Annual physical exam Normal physical exam today. Will check labs as below and f/u pending lab results. If labs are stable and WNL she will not need to have these rechecked for one year at her next annual physical exam. She is to call the office in the meantime if she has any acute issue, questions or concerns. - CBC with Differential/Platelet - Comprehensive metabolic panel - Hemoglobin A1c - TSH  2. Cervical cancer screening Pap collected today. Will send as below and f/u pending results. - Pap IG and HPV (high risk) DNA detection  3. Papanicolaou smear of cervix with positive high risk human papilloma virus (HPV) test Previous pap in 2015 was HPV positive.  - Pap IG and HPV (high risk) DNA detection  4. Encounter for screening mammogram for breast cancer Breast exam today was normal. There is no family history of breast cancer. She does perform regular self breast exams. Mammogram was ordered as below. Information for [facility] was given to  patient so she may schedule her mammogram at her convenience. - MM DIGITAL SCREENING BILATERAL; Future  5. Encounter for lipid screening for cardiovascular disease Will check labs as below and f/u pending results. - Lipid panel  6. Family history of diabetes mellitus Will check labs as below and f/u pending results. - Hemoglobin A1c  7. Essential hypertension  Stable. Continue HCTZ 12.5mg . - Comprehensive metabolic panel  --------------------------------------------------------------------    Mar Daring, PA-C  [facility]

## 2017-11-15 NOTE — Patient Instructions (Signed)

## 2017-11-16 ENCOUNTER — Telehealth: Payer: Self-pay

## 2017-11-16 LAB — CBC WITH DIFFERENTIAL/PLATELET
BASOS: 1 %
Basophils Absolute: 0.1 10*3/uL (ref 0.0–0.2)
EOS (ABSOLUTE): 0.5 10*3/uL — ABNORMAL HIGH (ref 0.0–0.4)
EOS: 6 %
HEMATOCRIT: 43.8 % (ref 34.0–46.6)
Hemoglobin: 14.6 g/dL (ref 11.1–15.9)
IMMATURE GRANULOCYTES: 0 %
Immature Grans (Abs): 0 10*3/uL (ref 0.0–0.1)
LYMPHS ABS: 2.2 10*3/uL (ref 0.7–3.1)
Lymphs: 26 %
MCH: 32.2 pg (ref 26.6–33.0)
MCHC: 33.3 g/dL (ref 31.5–35.7)
MCV: 97 fL (ref 79–97)
MONOS ABS: 0.6 10*3/uL (ref 0.1–0.9)
Monocytes: 7 %
NEUTROS PCT: 60 %
Neutrophils Absolute: 5 10*3/uL (ref 1.4–7.0)
Platelets: 334 10*3/uL (ref 150–379)
RBC: 4.53 x10E6/uL (ref 3.77–5.28)
RDW: 14.4 % (ref 12.3–15.4)
WBC: 8.5 10*3/uL (ref 3.4–10.8)

## 2017-11-16 LAB — COMPREHENSIVE METABOLIC PANEL
A/G RATIO: 2 (ref 1.2–2.2)
ALBUMIN: 4.6 g/dL (ref 3.5–5.5)
ALT: 14 IU/L (ref 0–32)
AST: 22 IU/L (ref 0–40)
Alkaline Phosphatase: 91 IU/L (ref 39–117)
BUN / CREAT RATIO: 8 — AB (ref 9–23)
BUN: 7 mg/dL (ref 6–24)
Bilirubin Total: 0.3 mg/dL (ref 0.0–1.2)
CALCIUM: 9.6 mg/dL (ref 8.7–10.2)
CO2: 26 mmol/L (ref 20–29)
Chloride: 100 mmol/L (ref 96–106)
Creatinine, Ser: 0.83 mg/dL (ref 0.57–1.00)
GFR, EST AFRICAN AMERICAN: 101 mL/min/{1.73_m2} (ref >59)
GFR, EST NON AFRICAN AMERICAN: 87 mL/min/{1.73_m2} (ref >59)
GLOBULIN, TOTAL: 2.3 g/dL (ref 1.5–4.5)
Glucose: 95 mg/dL (ref 65–99)
Potassium: 4.1 mmol/L (ref 3.5–5.2)
SODIUM: 142 mmol/L (ref 134–144)
Total Protein: 6.9 g/dL (ref 6.0–8.5)

## 2017-11-16 LAB — HEMOGLOBIN A1C
ESTIMATED AVERAGE GLUCOSE: 108 mg/dL
Hgb A1c MFr Bld: 5.4 % (ref 4.8–5.6)

## 2017-11-16 LAB — LIPID PANEL
CHOL/HDL RATIO: 3.1 ratio (ref 0.0–4.4)
Cholesterol, Total: 234 mg/dL — ABNORMAL HIGH (ref 100–199)
HDL: 76 mg/dL (ref >39)
LDL CALC: 126 mg/dL — AB (ref 0–99)
Triglycerides: 162 mg/dL — ABNORMAL HIGH (ref 0–149)
VLDL CHOLESTEROL CAL: 32 mg/dL (ref 5–40)

## 2017-11-16 LAB — TSH: TSH: 0.604 u[IU]/mL (ref 0.450–4.500)

## 2017-11-16 NOTE — Telephone Encounter (Signed)
Unable to leave VM due to VM not set up. Will try again later.

## 2017-11-16 NOTE — Telephone Encounter (Signed)
-----   Message from Mar Daring, PA-C sent at 11/16/2017  1:25 PM EDT ----- Cholesterol borderline high. Work on lifestyle modifications with low fat, low carb dieting and increasing physical activity to work up to 150 min moderate activity per week. All other labs are WNL and stable. Awaiting pap results.

## 2017-11-17 LAB — PAP IG AND HPV HIGH-RISK
HPV, HIGH-RISK: NEGATIVE
PAP Smear Comment: 0

## 2017-11-17 NOTE — Telephone Encounter (Signed)
Viewed by Veto Kemps on 11/16/2017 6:43 PM

## 2017-11-25 ENCOUNTER — Other Ambulatory Visit: Payer: Self-pay | Admitting: Internal Medicine

## 2017-12-18 ENCOUNTER — Other Ambulatory Visit: Payer: Self-pay | Admitting: Physician Assistant

## 2017-12-18 DIAGNOSIS — G43009 Migraine without aura, not intractable, without status migrainosus: Secondary | ICD-10-CM

## 2020-08-16 ENCOUNTER — Ambulatory Visit: Admission: EM | Admit: 2020-08-16 | Discharge: 2020-08-16 | Discharge status: Absent without leave | Payer: Self-pay

## 2020-08-17 ENCOUNTER — Other Ambulatory Visit: Payer: Self-pay

## 2020-08-17 ENCOUNTER — Ambulatory Visit
Admission: EM | Admit: 2020-08-17 | Discharge: 2020-08-17 | Disposition: A | Discharge status: Home / Self Care | Payer: Self-pay | Attending: Physician Assistant | Admitting: Physician Assistant

## 2020-08-17 VITALS — BP 146/84 | HR 89 | Temp 98.2°F | Resp 15 | Ht 60.0 in | Wt 125.0 lb

## 2020-08-17 DIAGNOSIS — R059 Cough, unspecified: Secondary | ICD-10-CM | POA: Insufficient documentation

## 2020-08-17 DIAGNOSIS — J209 Acute bronchitis, unspecified: Secondary | ICD-10-CM | POA: Insufficient documentation

## 2020-08-17 DIAGNOSIS — F17210 Nicotine dependence, cigarettes, uncomplicated: Secondary | ICD-10-CM | POA: Insufficient documentation

## 2020-08-17 DIAGNOSIS — J029 Acute pharyngitis, unspecified: Secondary | ICD-10-CM | POA: Insufficient documentation

## 2020-08-17 DIAGNOSIS — I10 Essential (primary) hypertension: Secondary | ICD-10-CM | POA: Insufficient documentation

## 2020-08-17 DIAGNOSIS — Z79899 Other long term (current) drug therapy: Secondary | ICD-10-CM | POA: Insufficient documentation

## 2020-08-17 DIAGNOSIS — B370 Candidal stomatitis: Secondary | ICD-10-CM | POA: Insufficient documentation

## 2020-08-17 DIAGNOSIS — Z20822 Contact with and (suspected) exposure to covid-19: Secondary | ICD-10-CM | POA: Insufficient documentation

## 2020-08-17 LAB — RESP PANEL BY RT-PCR (FLU A&B, COVID) ARPGX2
Influenza A by PCR: NEGATIVE
Influenza B by PCR: NEGATIVE
SARS Coronavirus 2 by RT PCR: NEGATIVE

## 2020-08-17 LAB — GROUP A STREP BY PCR: Group A Strep by PCR: NOT DETECTED

## 2020-08-17 MED ORDER — PREDNISONE 20 MG PO TABS: 40.0000 mg | ORAL_TABLET | Freq: Every day | ORAL | 0 refills | Status: AC

## 2020-08-17 MED ORDER — CHERATUSSIN AC 100-10 MG/5ML PO SOLN: 5.0000 mL | Freq: Four times a day (QID) | ORAL | 0 refills | Status: AC | PRN

## 2020-08-17 MED ORDER — NYSTATIN 100000 UNIT/ML MT SUSP: 5.0000 mL | Freq: Four times a day (QID) | OROMUCOSAL | 0 refills | Status: AC

## 2020-08-17 NOTE — ED Triage Notes (Signed)
Patient c/o nasal congestion, cough, chest congestion that started 3-4 days.  Patient denies fevers.

## 2020-08-17 NOTE — ED Provider Notes (Signed)
[facility]    CSN: [id number] Arrival date & time: 08/17/20  0858      History   Chief Complaint Chief Complaint  Patient presents with  . Cough    HPI Michele Kim is a 44 y.o. female presenting for 3 to 4-day history of productive cough.  She says the cough is productive of yellow/green sputum.  Denies any associated breathing difficulty or chest pain.  Denies any fevers.  She also admits to sore throat.  Patient states that her tongue also hurts.  Patient denies any known COVID-19 exposure and has not been vaccinated for COVID-19.  Has been taking over-the-counter Mucinex max and says that her cough is still keeping her up at night.  Patient denies any history of COPD or asthma.  She is a current smoker.  Other past medical history is significant for hypertension and migraines.  She has no other complaints or concerns today.  HPI  Past Medical History:  Diagnosis Date  . Diverticulosis   . Frequent headaches    H/O  . Gastric ulcer   . Gastritis   . Hair loss   . Hyperplastic colon polyp   . IBS (irritable bowel syndrome)   . Miscarriage    x 2  . Vitamin D deficiency     Patient Active Problem List   Diagnosis Date Noted  . Migraine without aura and without status migrainosus, not intractable 08/10/2017  . Essential hypertension 08/10/2017  . Hair loss 02/05/2014  . Miscarriage 02/05/2014  . Vitamin D deficiency 02/05/2014  . Stress 02/05/2014    Past Surgical History:  Procedure Laterality Date  . COLONOSCOPY    . miscarriage  2014  . UPPER GASTROINTESTINAL ENDOSCOPY      OB History   No obstetric history on file.      Home Medications    Prior to Admission medications   Medication Sig Start Date End Date Taking? Authorizing Provider  DIPHENHYDRAMINE HCL PO Take 10 mLs by mouth 4 (four) times daily as needed. Magic mouth wash    [provider]  guaiFENesin-codeine (CHERATUSSIN AC) 100-10 MG/5ML syrup Take 5 mLs by mouth 4  (four) times daily as needed for up to 7 days for cough. 08/17/20 08/24/20  Danton Clap, PA-C  hydrochlorothiazide (MICROZIDE) 12.5 MG capsule Take 1 capsule (12.5 mg total) by mouth daily. 09/07/17   Mar Daring, PA-C  methocarbamol (ROBAXIN) 500 MG tablet Take 0.5-1 tablets (250-500 mg total) by mouth at bedtime. 09/07/17   Mar Daring, PA-C  nortriptyline (PAMELOR) 10 MG capsule TAKE 1 CAPSULE (10 MG TOTAL) BY MOUTH AT BEDTIME. 12/19/17   Burnette, Clearnce Sorrel, PA-C  nystatin (MYCOSTATIN) 100000 UNIT/ML suspension Take 5 mLs (500,000 Units total) by mouth 4 (four) times daily for 14 days. Swish and retain in mouth as long as possible and then spit 08/17/20 08/31/20  Laurene Footman B, PA-C  pantoprazole (PROTONIX) 40 MG tablet Take 1 tablet (40 mg total) by mouth daily. 08/04/17   Pyrtle, Lajuan Lines, MD  predniSONE (DELTASONE) 20 MG tablet Take 2 tablets (40 mg total) by mouth daily for 5 days. 08/17/20 08/22/20  Danton Clap, PA-C  promethazine (PHENERGAN) 25 MG tablet Take 1 tablet (25 mg total) every 6 (six) hours as needed by mouth for nausea or vomiting. Patient not taking: No sig reported 07/22/17   Norval Gable, MD    Family History Family History  Problem Relation Age of Onset  . Breast cancer  Mother   . Hyperlipidemia Mother   . Hypertension Mother   . Heart disease Father   . Hypertension Father   . Lung cancer Maternal Uncle   . Heart disease Maternal Grandmother   . Hypertension Maternal Grandmother   . Diabetes Maternal Grandmother   . Alcohol abuse Maternal Grandfather   . Stroke Maternal Grandfather   . Stroke Paternal Grandfather   . Alcohol abuse Paternal Grandfather   . Lung disease Paternal Grandfather   . Heart disease Paternal Grandfather   . Anxiety disorder Brother   . Breast cancer Maternal Aunt   . Heart disease Paternal Aunt   . Heart disease Paternal Aunt   . Heart disease Paternal Aunt   . Diabetes Maternal Aunt   . Colon cancer Neg Hx      Social History Social History   Tobacco Use  . Smoking status: Current Every Day Smoker    Packs/day: 1.00    Types: Cigarettes  . Smokeless tobacco: Never Used  Vaping Use  . Vaping Use: Never used  Substance Use Topics  . Alcohol use: Yes    Alcohol/week: 0.0 standard drinks    Comment: occasional  . Drug use: No     Allergies   Patient has no known allergies.   Review of Systems Review of Systems  Constitutional: Positive for fatigue. Negative for chills, diaphoresis and fever.  HENT: Positive for congestion, rhinorrhea, sinus pressure and sore throat. Negative for ear pain and sinus pain.   Respiratory: Positive for cough. Negative for shortness of breath and wheezing.   Cardiovascular: Negative for chest pain.  Gastrointestinal: Negative for abdominal pain, nausea and vomiting.  Musculoskeletal: Negative for arthralgias and myalgias.  Skin: Negative for rash.  Neurological: Negative for weakness and headaches.  Hematological: Negative for adenopathy.     Physical Exam Triage Vital Signs ED Triage Vitals  Enc Vitals Group     BP 08/17/20 0927 (!) 146/84     Pulse Rate 08/17/20 0927 89     Resp 08/17/20 0927 15     Temp 08/17/20 0927 98.2 F (36.8 C)     Temp Source 08/17/20 0927 Oral     SpO2 08/17/20 0927 95 %     Weight 08/17/20 0923 125 lb (56.7 kg)     Height 08/17/20 0923 5' (1.524 m)     Head Circumference --      Peak Flow --      Pain Score 08/17/20 0923 5     Pain Loc --      Pain Edu? --      Excl. in [employer]? --    No data found.  Updated Vital Signs BP (!) 146/84 (BP Location: Right Arm)   Pulse 89   Temp 98.2 F (36.8 C) (Oral)   Resp 15   Ht 5' (1.524 m)   Wt 125 lb (56.7 kg)   LMP 08/03/2020 (Approximate)   SpO2 95%   BMI 24.41 kg/m      Physical Exam Vitals and nursing note reviewed.  Constitutional:      General: She is not in acute distress.    Appearance: Normal appearance. She is not ill-appearing or toxic-appearing.   HENT:     Head: Normocephalic and atraumatic.     Nose: Congestion and rhinorrhea present.     Mouth/Throat:     Mouth: Mucous membranes are moist.     Pharynx: Oropharynx is clear. Posterior oropharyngeal erythema present.     Comments: Whitish-gray  exudates on tongue and mild posterior pharyngeal erythema Eyes:     General: No scleral icterus.       Right eye: No discharge.        Left eye: No discharge.     Conjunctiva/sclera: Conjunctivae normal.  Cardiovascular:     Rate and Rhythm: Normal rate and regular rhythm.     Heart sounds: Normal heart sounds.  Pulmonary:     Effort: Pulmonary effort is normal. No respiratory distress.     Breath sounds: Wheezing (diffuse mild/moderate wheezing and rhonchi throughout) and rhonchi present.  Musculoskeletal:     Cervical back: Neck supple.  Skin:    General: Skin is dry.  Neurological:     General: No focal deficit present.     Mental Status: She is alert. Mental status is at baseline.     Motor: No weakness.     Gait: Gait normal.  Psychiatric:        Mood and Affect: Mood normal.        Behavior: Behavior normal.        Thought Content: Thought content normal.      UC Treatments / Results  Labs (all labs ordered are listed, but only abnormal results are displayed) Labs Reviewed  GROUP A STREP BY PCR  RESP PANEL BY RT-PCR (FLU A&B, COVID) [id number]    EKG   Radiology No results found.  Procedures Procedures (including critical care time)  Medications Ordered in UC Medications - No data to display  Initial Impression / Assessment and Plan / UC Course  I have reviewed the triage vital signs and the nursing notes.  Pertinent labs & imaging results that were available during my care of the patient were reviewed by me and considered in my medical decision making (see chart for details).   Molecular strep and respiratory panel negative.  Patient's exam consistent with oral thrush.  Treating with nystatin mouthwash.   Advised to follow-up with PCP if this does not improve.  Chest auscultation reveals wheezing and rhonchi throughout.  Suspect viral bronchitis.  Treating with prednisone and Cheratussin.  Advised increasing rest and fluids.  Controlled substance database reviewed and patient low risk for abuse.  Advised continuing supportive care.  ED precautions reviewed.  Final Clinical Impressions(s) / UC Diagnoses   Final diagnoses:  Acute bronchitis, unspecified organism  Cough  Sore throat  Thrush     Discharge Instructions     BRONCHITIS: Your strep test is negative.  Your flu and Covid test is also negative.  He has a viral chest cold.  Take Mucinex D throughout the day and drink plenty of fluids to break up the mucus . Take cough syrup at bedtime if needed, for cough and to assist with sleep. Take Ibuprofen or other NSAID for relief of any pleuritic pain. Increase rest and fluid intake. Return to the clinic, your PCP, or ER if you have any new/ worsening symptoms such as fever,  chest pain, difficulty breathing, worsening cough, mental status changes, lethargy, etc.     ED Prescriptions    Medication Sig Dispense Auth. Provider   predniSONE (DELTASONE) 20 MG tablet Take 2 tablets (40 mg total) by mouth daily for 5 days. 10 tablet Laurene Footman B, PA-C   nystatin (MYCOSTATIN) 100000 UNIT/ML suspension Take 5 mLs (500,000 Units total) by mouth 4 (four) times daily for 14 days. Swish and retain in mouth as long as possible and then spit 200 mL Danton Clap, PA-C  guaiFENesin-codeine (CHERATUSSIN AC) 100-10 MG/5ML syrup Take 5 mLs by mouth 4 (four) times daily as needed for up to 7 days for cough. 150 mL Danton Clap, PA-C     I have reviewed the PDMP during this encounter.   Danton Clap, PA-C 08/17/20 1018

## 2020-08-17 NOTE — Discharge Instructions (Addendum)
BRONCHITIS: Your strep test is negative.  Your flu and Covid test is also negative.  He has a viral chest cold.  Take Mucinex D throughout the day and drink plenty of fluids to break up the mucus . Take cough syrup at bedtime if needed, for cough and to assist with sleep. Take Ibuprofen or other NSAID for relief of any pleuritic pain. Increase rest and fluid intake. Return to the clinic, your PCP, or ER if you have any new/ worsening symptoms such as fever,  chest pain, difficulty breathing, worsening cough, mental status changes, lethargy, etc.

## 2020-09-12 ENCOUNTER — Ambulatory Visit
Admission: RE | Admit: 2020-09-12 | Discharge: 2020-09-12 | Disposition: A | Discharge status: Home / Self Care | Payer: HRSA Program | Source: Ambulatory Visit | Attending: Physician Assistant | Admitting: Physician Assistant

## 2020-09-12 ENCOUNTER — Other Ambulatory Visit: Payer: Self-pay

## 2020-09-12 ENCOUNTER — Ambulatory Visit (INDEPENDENT_AMBULATORY_CARE_PROVIDER_SITE_OTHER): Payer: HRSA Program

## 2020-09-12 VITALS — BP 179/122 | HR 101 | Temp 98.3°F | Resp 18 | Ht 60.0 in | Wt 125.0 lb

## 2020-09-12 DIAGNOSIS — J029 Acute pharyngitis, unspecified: Secondary | ICD-10-CM | POA: Insufficient documentation

## 2020-09-12 DIAGNOSIS — J40 Bronchitis, not specified as acute or chronic: Secondary | ICD-10-CM | POA: Insufficient documentation

## 2020-09-12 DIAGNOSIS — I10 Essential (primary) hypertension: Secondary | ICD-10-CM | POA: Insufficient documentation

## 2020-09-12 DIAGNOSIS — Z20822 Contact with and (suspected) exposure to covid-19: Secondary | ICD-10-CM | POA: Insufficient documentation

## 2020-09-12 DIAGNOSIS — R059 Cough, unspecified: Secondary | ICD-10-CM

## 2020-09-12 DIAGNOSIS — Z720 Tobacco use: Secondary | ICD-10-CM

## 2020-09-12 DIAGNOSIS — F17210 Nicotine dependence, cigarettes, uncomplicated: Secondary | ICD-10-CM | POA: Insufficient documentation

## 2020-09-12 LAB — COMPREHENSIVE METABOLIC PANEL
ALT: 15 U/L (ref 0–44)
AST: 17 U/L (ref 15–41)
Albumin: 3.3 g/dL — ABNORMAL LOW (ref 3.5–5.0)
Alkaline Phosphatase: 60 U/L (ref 38–126)
Anion gap: 10 (ref 5–15)
BUN: 10 mg/dL (ref 6–20)
CO2: 26 mmol/L (ref 22–32)
Calcium: 8.1 mg/dL — ABNORMAL LOW (ref 8.9–10.3)
Chloride: 106 mmol/L (ref 98–111)
Creatinine, Ser: 0.67 mg/dL (ref 0.44–1.00)
GFR, Estimated: >60 mL/min (ref >60)
Glucose, Bld: 93 mg/dL (ref 70–99)
Potassium: 3.9 mmol/L (ref 3.5–5.1)
Sodium: 142 mmol/L (ref 135–145)
Total Bilirubin: 0.2 mg/dL — ABNORMAL LOW (ref 0.3–1.2)
Total Protein: 6.1 g/dL — ABNORMAL LOW (ref 6.5–8.1)

## 2020-09-12 LAB — CBC
HCT: 39 % (ref 36.0–46.0)
Hemoglobin: 12.8 g/dL (ref 12.0–15.0)
MCH: 30.3 pg (ref 26.0–34.0)
MCHC: 32.8 g/dL (ref 30.0–36.0)
MCV: 92.2 fL (ref 80.0–100.0)
Platelets: 288 10*3/uL (ref 150–400)
RBC: 4.23 MIL/uL (ref 3.87–5.11)
RDW: 13.5 % (ref 11.5–15.5)
WBC: 9.1 10*3/uL (ref 4.0–10.5)
nRBC: 0 % (ref 0.0–0.2)

## 2020-09-12 LAB — SARS CORONAVIRUS 2 (TAT 6-24 HRS): SARS Coronavirus 2: NEGATIVE

## 2020-09-12 LAB — GROUP A STREP BY PCR: Group A Strep by PCR: NOT DETECTED

## 2020-09-12 IMAGING — CR DG CHEST 2V
2 series · 2 of 2 positions shown · non-contrast
Comparison: None.

CLINICAL DATA: Cough for 1 month.  Smoker.

EXAM:
CHEST - 2 VIEW

[chest pa]
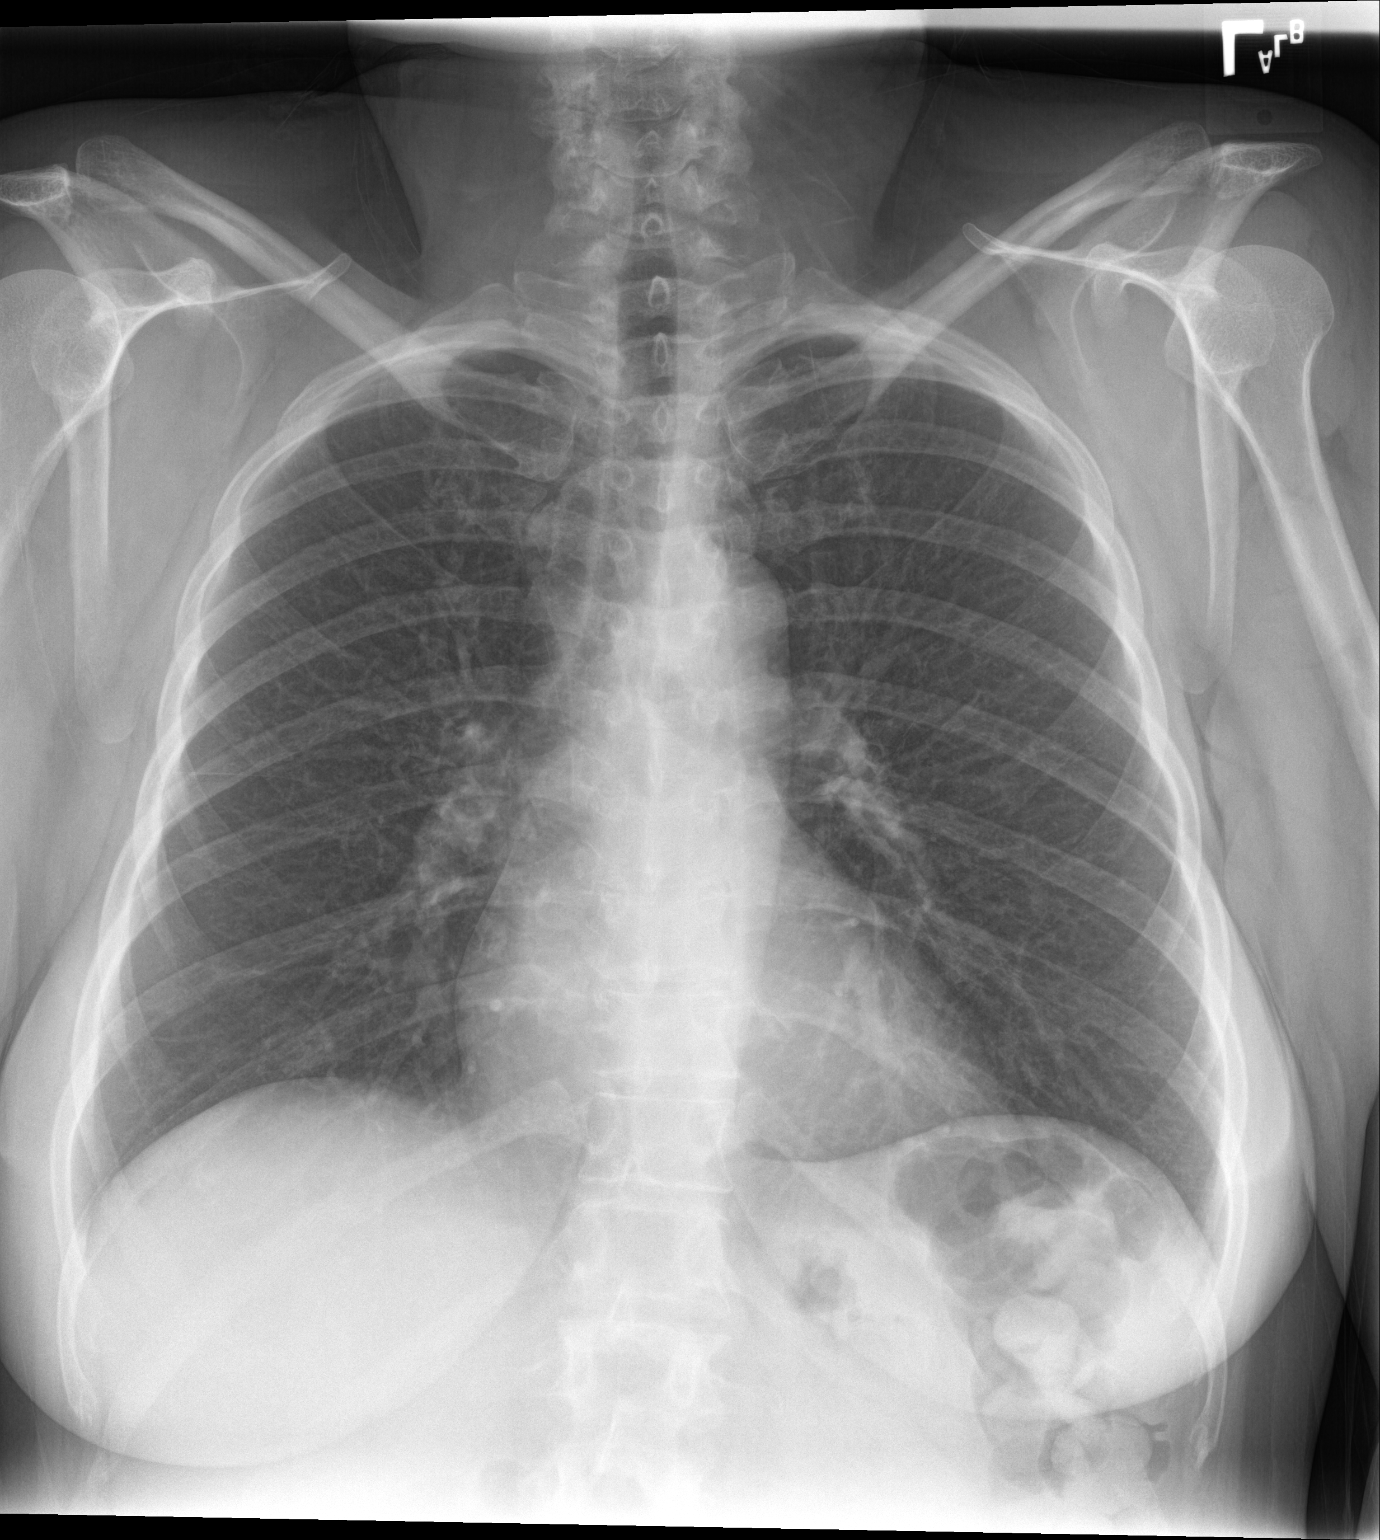

[chest lat]
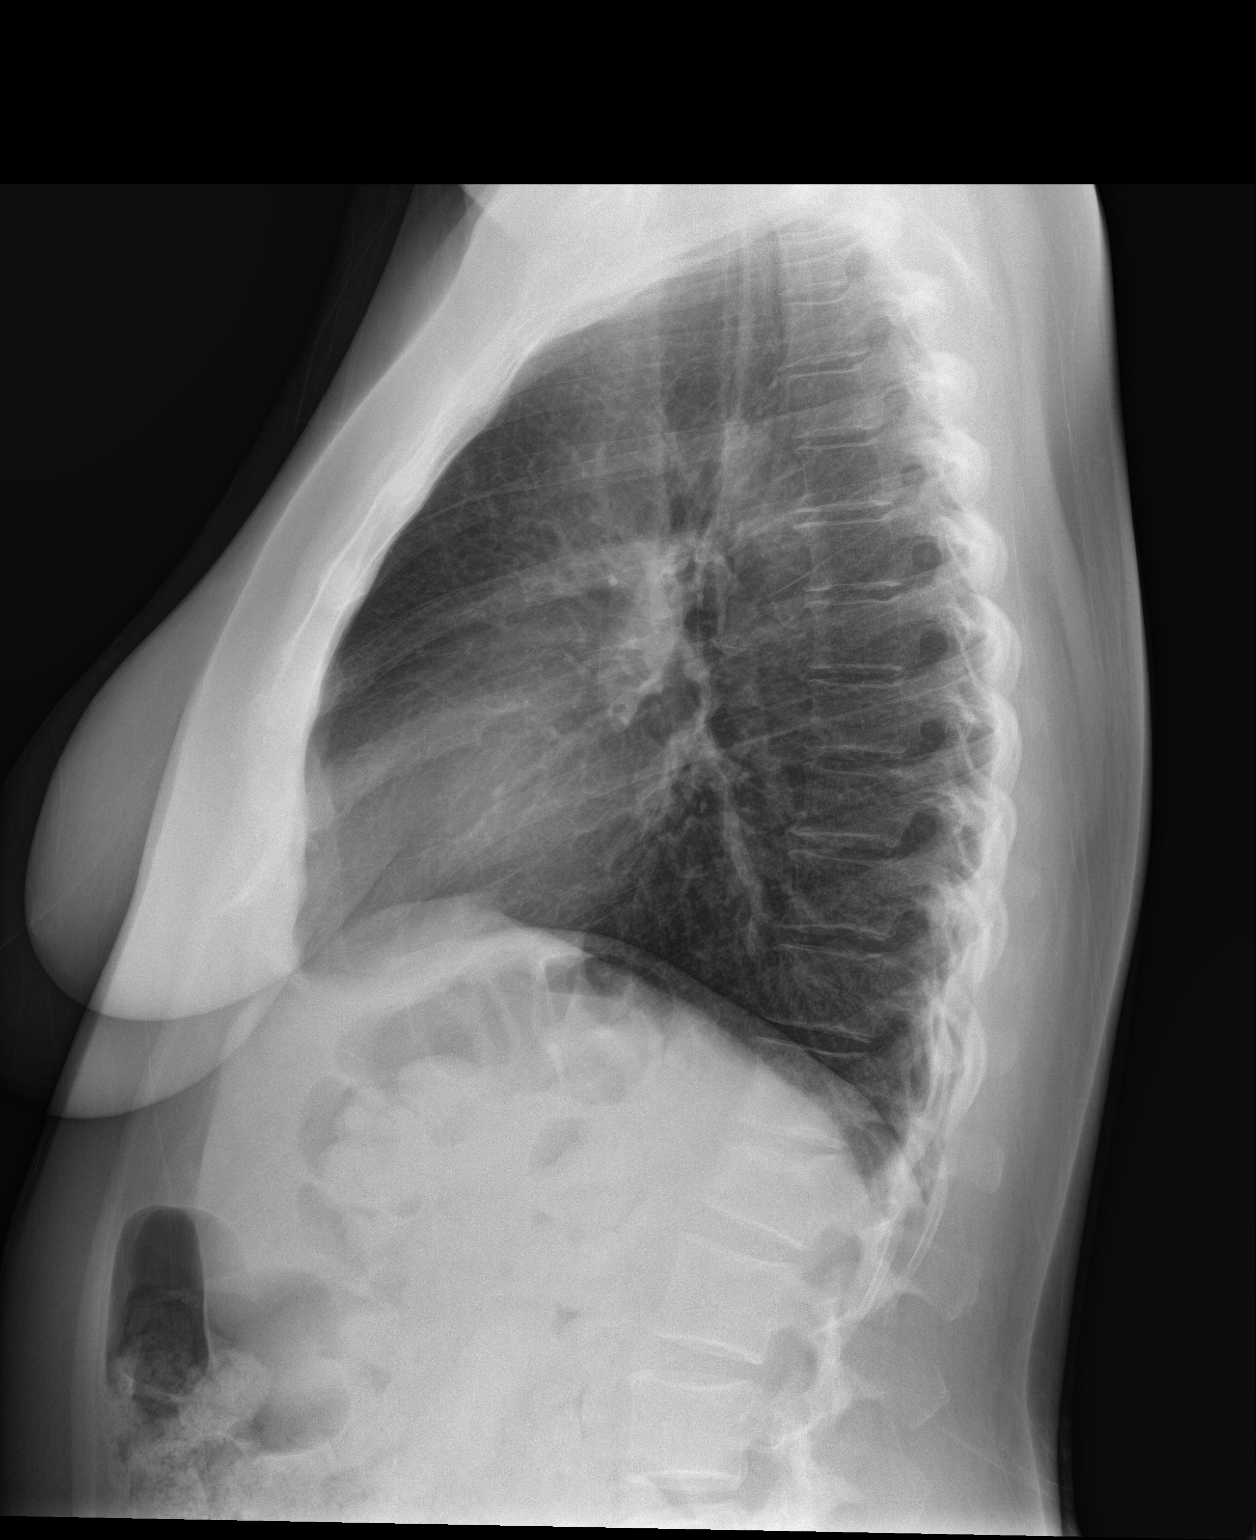

[2 of 2 positions shown; findings below may reference images not displayed]

FINDINGS: Midline trachea.  Normal heart size and mediastinal contours.

Sharp costophrenic angles.  No pneumothorax.  Clear lungs.

Mild wedging of a vertebral body at the thoracolumbar junction,
likely T12.
IMPRESSION: No active cardiopulmonary disease.

Mild thoracolumbar junction vertebral body wedging, of indeterminate
acuity.

## 2020-09-12 MED ORDER — DOXYCYCLINE HYCLATE 100 MG PO CAPS: 100.0000 mg | ORAL_CAPSULE | Freq: Two times a day (BID) | ORAL | 0 refills | Status: DC

## 2020-09-12 MED ORDER — AMLODIPINE BESYLATE 2.5 MG PO TABS: 2.5000 mg | ORAL_TABLET | Freq: Every morning | ORAL | 0 refills | Status: DC

## 2020-09-12 MED ORDER — FLUCONAZOLE 150 MG PO TABS: 150.0000 mg | ORAL_TABLET | Freq: Every day | ORAL | 0 refills | Status: DC

## 2020-09-12 NOTE — ED Provider Notes (Signed)
[facility]    CSN: [id number] Arrival date & time: 09/12/20  0919      History   Chief Complaint Chief Complaint  Patient presents with  . Cough  . Appointment    HPI Michele Kim is a 45 y.o. female who presents with unresolved cough since sick last month and yesterday her cough got worse, and her throat feels on fire. Was treated  For viral bronchitis and oral thrush last month. She is still smoking.   Has hx of HTN and has been treated in the past, but lost her insurance and has not seen anyone in a couple of years. She does not check it often, but has been normal. Her new insurance does not start til 2/1    Past Medical History:  Diagnosis Date  . Diverticulosis   . Frequent headaches    H/O  . Gastric ulcer   . Gastritis   . Hair loss   . Hyperplastic colon polyp   . IBS (irritable bowel syndrome)   . Miscarriage    x 2  . Vitamin D deficiency     Patient Active Problem List   Diagnosis Date Noted  . Migraine without aura and without status migrainosus, not intractable 08/10/2017  . Essential hypertension 08/10/2017  . Hair loss 02/05/2014  . Miscarriage 02/05/2014  . Vitamin D deficiency 02/05/2014  . Stress 02/05/2014    Past Surgical History:  Procedure Laterality Date  . COLONOSCOPY    . miscarriage  2014  . UPPER GASTROINTESTINAL ENDOSCOPY      OB History   No obstetric history on file.      Home Medications    Prior to Admission medications   Medication Sig Start Date End Date Taking? Authorizing Provider  amLODipine (NORVASC) 2.5 MG tablet Take 1 tablet (2.5 mg total) by mouth in the morning. 09/12/20  Yes Rodriguez-Southworth, Sunday Spillers, PA-C  doxycycline (VIBRAMYCIN) 100 MG capsule Take 1 capsule (100 mg total) by mouth 2 (two) times daily. 09/12/20  Yes Rodriguez-Southworth, Sunday Spillers, PA-C  fluconazole (DIFLUCAN) 150 MG tablet Take 1 tablet (150 mg total) by mouth daily. 09/12/20  Yes Rodriguez-Southworth, Sunday Spillers, PA-C   methocarbamol (ROBAXIN) 500 MG tablet Take 0.5-1 tablets (250-500 mg total) by mouth at bedtime. 09/07/17  Yes Mar Daring, PA-C  nortriptyline (PAMELOR) 10 MG capsule TAKE 1 CAPSULE (10 MG TOTAL) BY MOUTH AT BEDTIME. 12/19/17  Yes Burnette, Clearnce Sorrel, PA-C  pantoprazole (PROTONIX) 40 MG tablet Take 1 tablet (40 mg total) by mouth daily. 08/04/17  Yes Pyrtle, Lajuan Lines, MD  hydrochlorothiazide (MICROZIDE) 12.5 MG capsule Take 1 capsule (12.5 mg total) by mouth daily. 09/07/17 09/12/20 Yes Burnette, Clearnce Sorrel, PA-C  DIPHENHYDRAMINE HCL PO Take 10 mLs by mouth 4 (four) times daily as needed. Magic mouth wash    [provider]  promethazine (PHENERGAN) 25 MG tablet Take 1 tablet (25 mg total) every 6 (six) hours as needed by mouth for nausea or vomiting. Patient not taking: No sig reported 07/22/17 09/12/20  Norval Gable, MD    Family History Family History  Problem Relation Age of Onset  . Breast cancer Mother   . Hyperlipidemia Mother   . Hypertension Mother   . Heart disease Father   . Hypertension Father   . Lung cancer Maternal Uncle   . Heart disease Maternal Grandmother   . Hypertension Maternal Grandmother   . Diabetes Maternal Grandmother   . Alcohol abuse Maternal Grandfather   . Stroke Maternal  Grandfather   . Stroke Paternal Grandfather   . Alcohol abuse Paternal Grandfather   . Lung disease Paternal Grandfather   . Heart disease Paternal Grandfather   . Anxiety disorder Brother   . Breast cancer Maternal Aunt   . Heart disease Paternal Aunt   . Heart disease Paternal Aunt   . Heart disease Paternal Aunt   . Diabetes Maternal Aunt   . Colon cancer Neg Hx     Social History Social History   Tobacco Use  . Smoking status: Current Every Day Smoker    Packs/day: 1.00    Types: Cigarettes  . Smokeless tobacco: Never Used  Vaping Use  . Vaping Use: Never used  Substance Use Topics  . Alcohol use: Yes    Alcohol/week: 0.0 standard drinks    Comment:  occasional  . Drug use: No     Allergies   Patient has no known allergies.   Review of Systems Review of Systems  Constitutional: Positive for diaphoresis and fatigue. Negative for appetite change, chills and fever.  HENT: Positive for postnasal drip and sore throat. Negative for congestion, ear discharge, ear pain and rhinorrhea.   Eyes: Negative for discharge.  Respiratory: Positive for cough and wheezing. Negative for chest tightness and shortness of breath.   Cardiovascular: Negative for chest pain.  Gastrointestinal: Negative for diarrhea, nausea and vomiting.  Musculoskeletal: Negative for gait problem and myalgias.  Skin: Negative for rash.  Neurological: Negative for headaches.  Hematological: Negative for adenopathy.     Physical Exam Triage Vital Signs ED Triage Vitals  Enc Vitals Group     BP 09/12/20 1003 (!) 179/122     Pulse Rate 09/12/20 1003 (!) 101     Resp 09/12/20 1003 18     Temp 09/12/20 1003 98.3 F (36.8 C)     Temp Source 09/12/20 1003 Oral     SpO2 09/12/20 1003 99 %     Weight 09/12/20 1001 125 lb (56.7 kg)     Height 09/12/20 1001 5' (1.524 m)     Head Circumference --      Peak Flow --      Pain Score 09/12/20 1000 6     Pain Loc --      Pain Edu? --      Excl. in [place]? --    No data found.  Updated Vital Signs BP (!) 179/122 (BP Location: Left Arm)   Pulse (!) 101   Temp 98.3 F (36.8 C) (Oral)   Resp 18   Ht 5' (1.524 m)   Wt 125 lb (56.7 kg)   LMP 08/22/2020 (Approximate)   SpO2 99%   BMI 24.41 kg/m   Visual Acuity Right Eye Distance:   Left Eye Distance:   Bilateral Distance:    Right Eye Near:   Left Eye Near:    Bilateral Near:     Physical Exam Constitutional:      General: She is not in acute distress.    Appearance: She is obese. She is not toxic-appearing.  HENT:     Head: Normocephalic.     Right Ear: Tympanic membrane, ear canal and external ear normal.     Left Ear: Tympanic membrane, ear canal and  external ear normal.     Nose: Congestion present.     Comments: Has white matter on the L septum    Mouth/Throat:     Mouth: Mucous membranes are moist.     Comments: No white patches  noted Eyes:     General: No scleral icterus.    Conjunctiva/sclera: Conjunctivae normal.  Cardiovascular:     Rate and Rhythm: Regular rhythm. Tachycardia present.     Heart sounds: No murmur heard.   Pulmonary:     Effort: Pulmonary effort is normal.     Breath sounds: Wheezing present.  Musculoskeletal:        General: Normal range of motion.     Cervical back: Neck supple.  Lymphadenopathy:     Cervical: No cervical adenopathy.  Skin:    General: Skin is warm and dry.  Neurological:     Mental Status: She is alert and oriented to person, place, and time.     Gait: Gait normal.  Psychiatric:        Mood and Affect: Mood normal.        Behavior: Behavior normal.        Thought Content: Thought content normal.        Judgment: Judgment normal.     UC Treatments / Results  Labs (all labs ordered are listed, but only abnormal results are displayed) Labs Reviewed  COMPREHENSIVE METABOLIC PANEL - Abnormal; Notable for the following components:      Result Value   Calcium 8.1 (*)    Total Protein 6.1 (*)    Albumin 3.3 (*)    Total Bilirubin 0.2 (*)    All other components within normal limits  GROUP A STREP BY PCR  SARS CORONAVIRUS 2 (TAT 6-24 HRS)  CBC    EKG   Radiology DG Chest 2 View  Result Date: 09/12/2020 CLINICAL DATA:  Cough for 1 month.  Smoker. EXAM: CHEST - 2 VIEW COMPARISON:  None. FINDINGS: Midline trachea.  Normal heart size and mediastinal contours. Sharp costophrenic angles.  No pneumothorax.  Clear lungs. Mild wedging of a vertebral body at the thoracolumbar junction, likely T12. IMPRESSION: No active cardiopulmonary disease. Mild thoracolumbar junction vertebral body wedging, of indeterminate acuity. Electronically Signed   By: Abigail Miyamoto M.D.   On: 09/12/2020  12:02    Procedures Procedures (including critical care time)  Medications Ordered in UC Medications - No data to display  Initial Impression / Assessment and Plan / UC Course  I have reviewed the triage vital signs and the nursing notes. Unresolved bronchitis and oral thrush. HTN recurrent. The last visit she also had elevation of her BP, but much worse today. I ordered CBC and CMP and all is normal except her protein and calcium are a little low. I went ahead and started her on Amlodipine 2.5 mg qd and needs to FU next month with new PCP. See instructions.  She was also prescribed Diflucan to take one weekly, and Doxy to cover possibly secondary infection since she is a smoker  Pertinent labs & imaging results that were available during my care of the patient were reviewed by me and considered in my medical decision making (see chart for details).  Final Clinical Impressions(s) / UC Diagnoses   Final diagnoses:  Bronchitis  Hypertension, uncontrolled  Acute pharyngitis, unspecified etiology     Discharge Instructions     Your cell count is normal, your chemistry shows normal kidney and liver functions, but your calcium and protein are low, so you need to increase food rich in calcium and eat more protein like meats, yogurt, nuts.  Your strep test was negative.  Your chest xray shows no pneumonia I will restart you on low dose blood pressure medication  called Amlodipine. Please monitor your blood pressure at least 3 times a week 2-3 hours after you take it, and keep record of this until you see your family doctor next month.   Use the Albuterol inaher every 4-6 hours for 7 days, then as needed only.  Work on stop smoking.     ED Prescriptions    Medication Sig Dispense Auth. Provider   amLODipine (NORVASC) 2.5 MG tablet Take 1 tablet (2.5 mg total) by mouth in the morning. 30 tablet Rodriguez-Southworth, Kryssa Risenhoover, PA-C   fluconazole (DIFLUCAN) 150 MG tablet Take 1 tablet (150  mg total) by mouth daily. 3 tablet Rodriguez-Southworth, Sunday Spillers, PA-C   doxycycline (VIBRAMYCIN) 100 MG capsule Take 1 capsule (100 mg total) by mouth 2 (two) times daily. 20 capsule Rodriguez-Southworth, Sunday Spillers, PA-C     PDMP not reviewed this encounter.   Shelby Mattocks, PA-C 09/12/20 1352

## 2020-09-12 NOTE — Discharge Instructions (Addendum)
Your cell count is normal, your chemistry shows normal kidney and liver functions, but your calcium and protein are low, so you need to increase food rich in calcium and eat more protein like meats, yogurt, nuts.  Your strep test was negative.  Your chest xray shows no pneumonia I will restart you on low dose blood pressure medication called Amlodipine. Please monitor your blood pressure at least 3 times a week 2-3 hours after you take it, and keep record of this until you see your family doctor next month.   Use the Albuterol inaher every 4-6 hours for 7 days, then as needed only.  Work on stop smoking.

## 2020-09-12 NOTE — ED Triage Notes (Signed)
Pt c/o cough, sore throat, and film on tongue. Started about a week ago. She was seen here for this 08/17/20 and treated. She states it was better for 2-3 days abut then started back.

## 2022-02-25 ENCOUNTER — Emergency Department: Payer: Self-pay

## 2022-02-25 ENCOUNTER — Other Ambulatory Visit: Payer: Self-pay

## 2022-02-25 ENCOUNTER — Observation Stay
Admission: EM | Admit: 2022-02-25 | Discharge: 2022-02-26 | Disposition: A | Discharge status: Home / Self Care | Payer: Self-pay | Attending: Internal Medicine | Admitting: Internal Medicine

## 2022-02-25 ENCOUNTER — Observation Stay: Admit: 2022-02-25 | Payer: Self-pay

## 2022-02-25 DIAGNOSIS — I10 Essential (primary) hypertension: Secondary | ICD-10-CM | POA: Insufficient documentation

## 2022-02-25 DIAGNOSIS — R2990 Unspecified symptoms and signs involving the nervous system: Secondary | ICD-10-CM

## 2022-02-25 DIAGNOSIS — F17210 Nicotine dependence, cigarettes, uncomplicated: Secondary | ICD-10-CM | POA: Insufficient documentation

## 2022-02-25 DIAGNOSIS — G459 Transient cerebral ischemic attack, unspecified: Principal | ICD-10-CM

## 2022-02-25 DIAGNOSIS — Z79899 Other long term (current) drug therapy: Secondary | ICD-10-CM | POA: Insufficient documentation

## 2022-02-25 DIAGNOSIS — I639 Cerebral infarction, unspecified: Secondary | ICD-10-CM

## 2022-02-25 DIAGNOSIS — F17200 Nicotine dependence, unspecified, uncomplicated: Secondary | ICD-10-CM | POA: Diagnosis present

## 2022-02-25 LAB — ETHANOL: Alcohol, Ethyl (B): <10 mg/dL (ref <10)

## 2022-02-25 LAB — APTT: aPTT: 32 seconds (ref 24–36)

## 2022-02-25 LAB — COMPREHENSIVE METABOLIC PANEL
ALT: 17 U/L (ref 0–44)
AST: 23 U/L (ref 15–41)
Albumin: 4.1 g/dL (ref 3.5–5.0)
Alkaline Phosphatase: 55 U/L (ref 38–126)
Anion gap: 7 (ref 5–15)
BUN: 14 mg/dL (ref 6–20)
CO2: 28 mmol/L (ref 22–32)
Calcium: 9.6 mg/dL (ref 8.9–10.3)
Chloride: 108 mmol/L (ref 98–111)
Creatinine, Ser: 0.76 mg/dL (ref 0.44–1.00)
GFR, Estimated: >60 mL/min (ref >60)
Glucose, Bld: 106 mg/dL — ABNORMAL HIGH (ref 70–99)
Potassium: 4 mmol/L (ref 3.5–5.1)
Sodium: 143 mmol/L (ref 135–145)
Total Bilirubin: 0.4 mg/dL (ref 0.3–1.2)
Total Protein: 6.9 g/dL (ref 6.5–8.1)

## 2022-02-25 LAB — DIFFERENTIAL
Abs Immature Granulocytes: 0.02 10*3/uL (ref 0.00–0.07)
Basophils Absolute: 0.1 10*3/uL (ref 0.0–0.1)
Basophils Relative: 2 %
Eosinophils Absolute: 0.8 10*3/uL — ABNORMAL HIGH (ref 0.0–0.5)
Eosinophils Relative: 11 %
Immature Granulocytes: 0 %
Lymphocytes Relative: 29 %
Lymphs Abs: 2.3 10*3/uL (ref 0.7–4.0)
Monocytes Absolute: 0.8 10*3/uL (ref 0.1–1.0)
Monocytes Relative: 10 %
Neutro Abs: 3.8 10*3/uL (ref 1.7–7.7)
Neutrophils Relative %: 48 %

## 2022-02-25 LAB — CBC
HCT: 41 % (ref 36.0–46.0)
Hemoglobin: 13.4 g/dL (ref 12.0–15.0)
MCH: 30.2 pg (ref 26.0–34.0)
MCHC: 32.7 g/dL (ref 30.0–36.0)
MCV: 92.6 fL (ref 80.0–100.0)
Platelets: 281 10*3/uL (ref 150–400)
RBC: 4.43 MIL/uL (ref 3.87–5.11)
RDW: 12.9 % (ref 11.5–15.5)
WBC: 7.7 10*3/uL (ref 4.0–10.5)
nRBC: 0 % (ref 0.0–0.2)

## 2022-02-25 LAB — HEMOGLOBIN A1C
Hgb A1c MFr Bld: 5.9 % — ABNORMAL HIGH (ref 4.8–5.6)
Mean Plasma Glucose: 122.63 mg/dL

## 2022-02-25 LAB — PROTIME-INR
INR: 1 (ref 0.8–1.2)
Prothrombin Time: 13 seconds (ref 11.4–15.2)

## 2022-02-25 LAB — CBG MONITORING, ED: Glucose-Capillary: 131 mg/dL — ABNORMAL HIGH (ref 70–99)

## 2022-02-25 IMAGING — MR MR HEAD W/O CM
13 series · 48 of 48 positions shown · non-contrast
Comparison: Same-day CT/CTA head

CLINICAL DATA: History of migraines, right-sided numbness

EXAM:
MRI HEAD WITHOUT CONTRAST
TECHNIQUE: Multiplanar, multiecho pulse sequences of the brain and surrounding
structures were obtained without intravenous contrast.

[Series 1: T2 · coronal · 5.0mm · 0.57mm/px · 2 of 29 slices shown (1 of 2)]
[im 1/29]
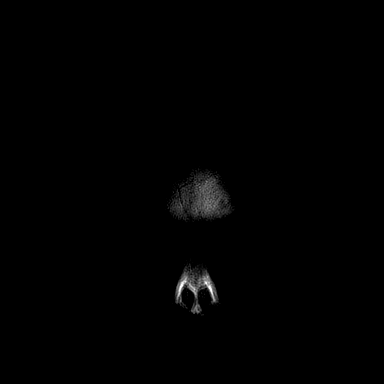
[im 29/29]
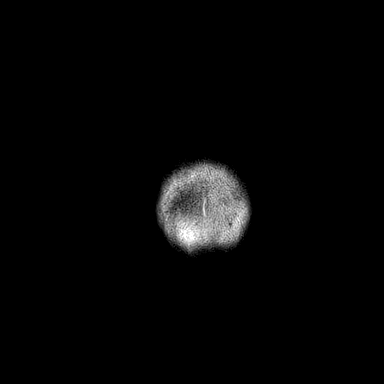

[Series 5: ax dwi_tracew · axial · 3.0mm · 0.65mm/px · z∈[-147,-1]mm · 3 of 46 slices shown]
[im 1/46]
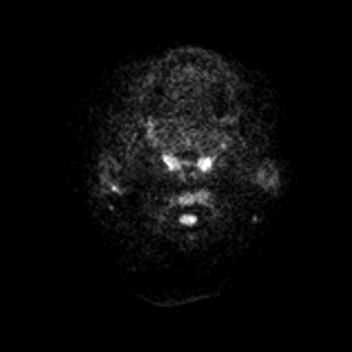
[im 23/46]
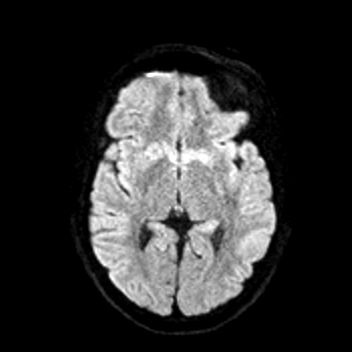
[im 46/46]
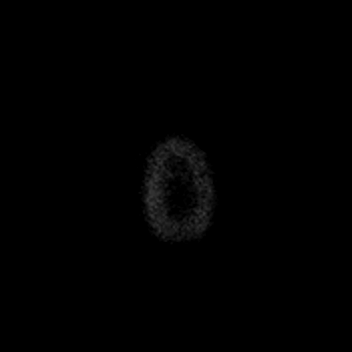

[Series 6: ax dwi_adc · axial · 3.0mm · 0.65mm/px · z∈[-147,-1]mm · 3 of 46 slices shown]
[im 1/46]
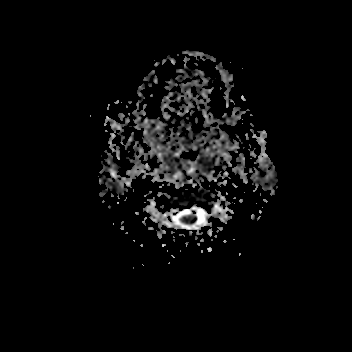
[im 23/46]
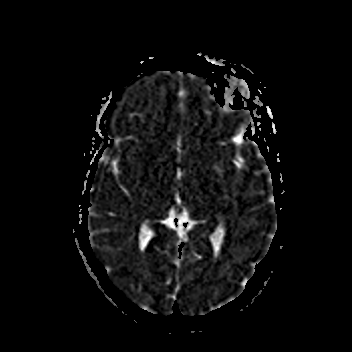
[im 46/46]
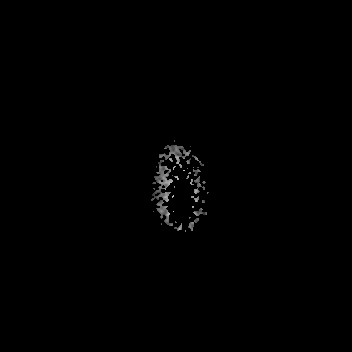

[Series 7: cor dwi_tracew · coronal · 5.0mm · 0.65mm/px · 3 of 36 slices shown]
[im 1/36]
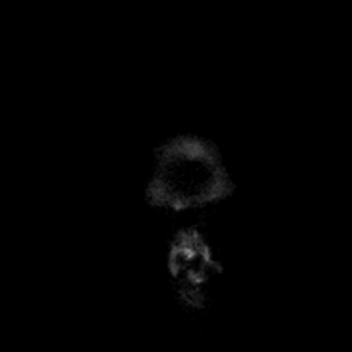
[im 18/36]
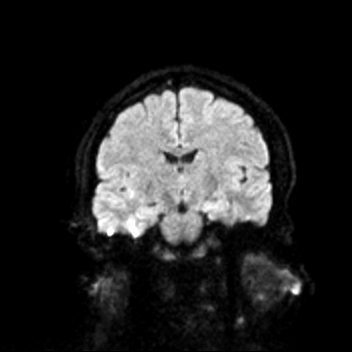
[im 36/36]
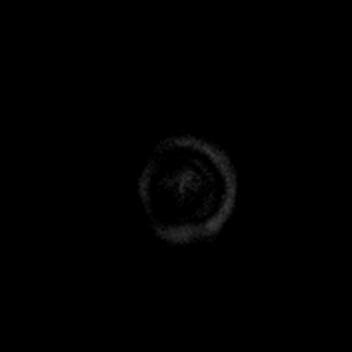

[Series 8: cor dwi_adc · coronal · 5.0mm · 0.65mm/px · 3 of 36 slices shown]
[im 1/36]
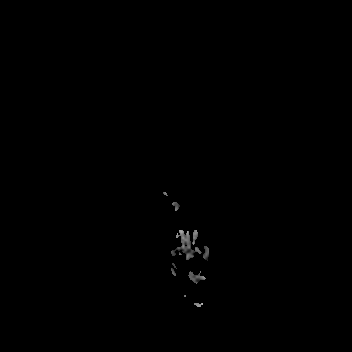
[im 18/36]
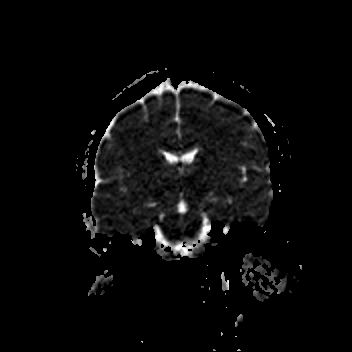
[im 36/36]
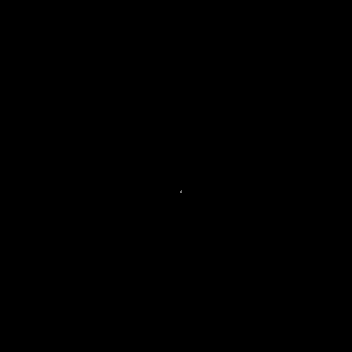

[Series 9: T1 · sagittal · 5.0mm · 0.62mm/px · 2 of 21 slices shown (1 of 2)]
[im 1/21]
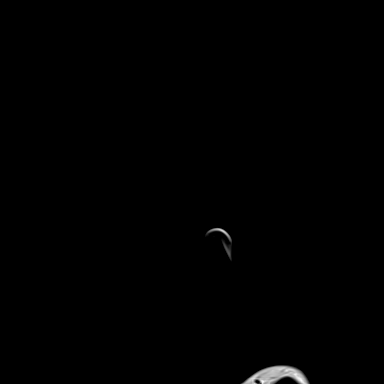
[im 21/21]
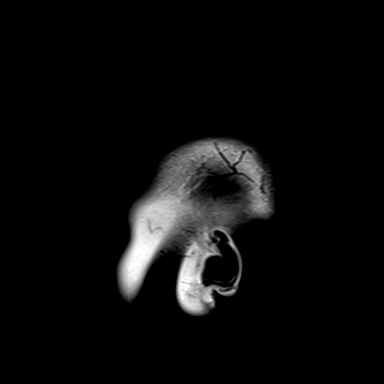

[Series 10: T2 · axial · 5.0mm · 0.53mm/px · z∈[-143,-2]mm · 2 of 25 slices shown (2 of 2)]
[im 1/25]
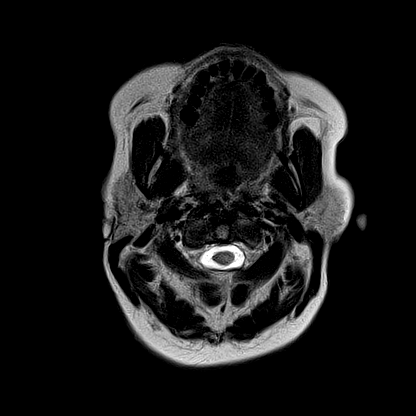
[im 25/25]
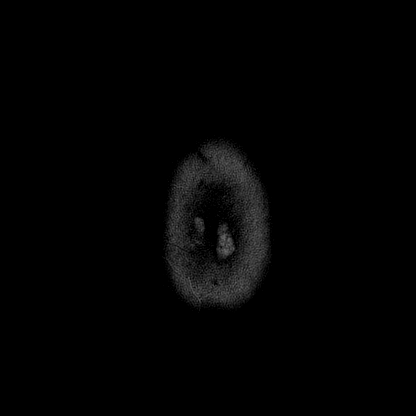

[Series 11: mag_images · axial · 3.0mm · 0.90mm/px · z∈[-159,+15]mm · 4 of 60 slices shown]
[im 1/60]
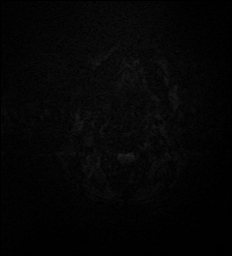
[im 20/60]
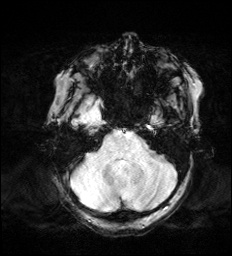
[im 40/60]
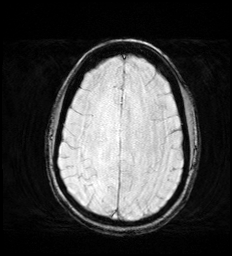
[im 60/60]
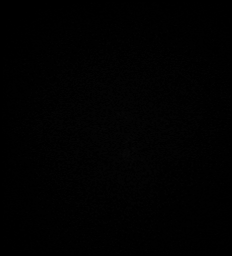

[Series 12: pha_images · axial · 3.0mm · 0.90mm/px · z∈[-156,+15]mm · 4 of 59 slices shown]
[im 1/59]
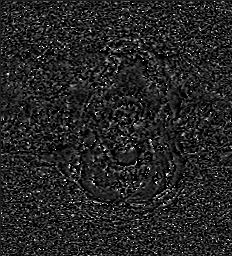
[im 20/59]
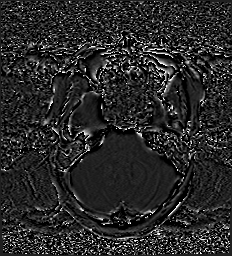
[im 39/59]
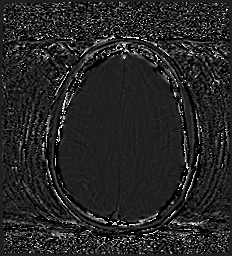
[im 59/59]
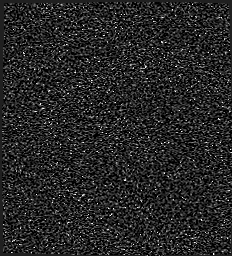

[Series 13: swi_images · axial · 3.0mm · 0.90mm/px · z∈[-159,+15]mm · 4 of 60 slices shown]
[im 1/60]
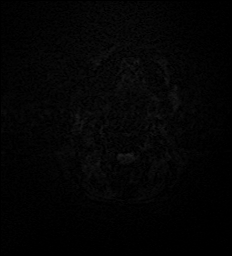
[im 20/60]
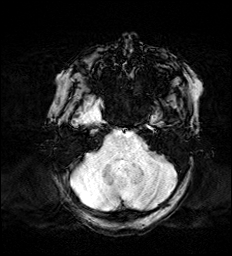
[im 40/60]
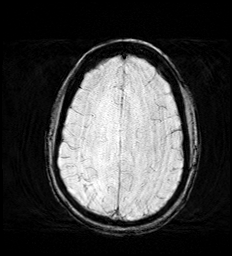
[im 60/60]
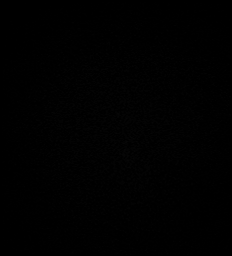

[Series 14: mip_images(sw) · axial · 24.0mm · 0.90mm/px · z∈[-149,+4]mm · 4 of 53 slices shown]
[im 1/53]
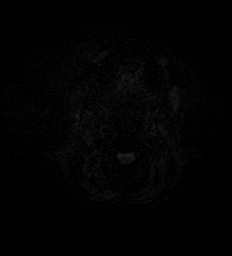
[im 18/53]
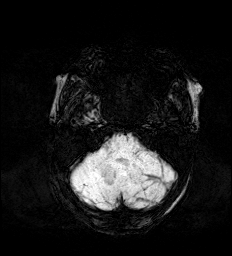
[im 35/53]
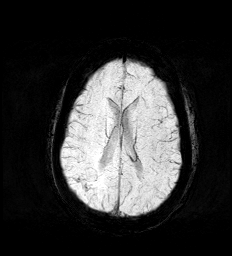
[im 53/53]
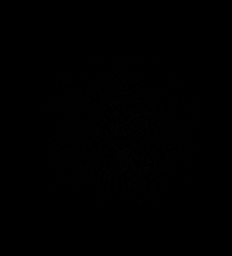

[Series 15: FLAIR · axial · 3.0mm · 0.53mm/px · z∈[-152,+7]mm · 4 of 55 slices shown]
[im 1/55]
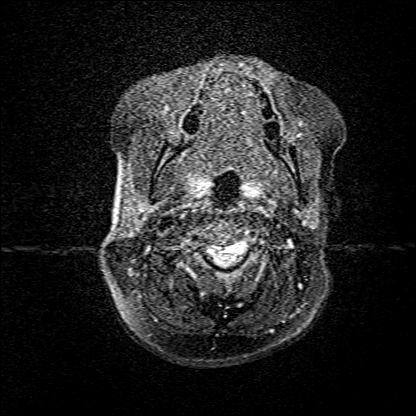
[im 19/55]
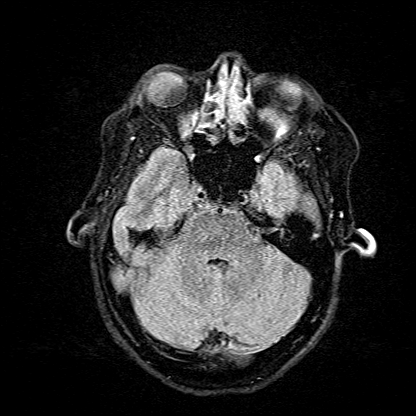
[im 37/55]
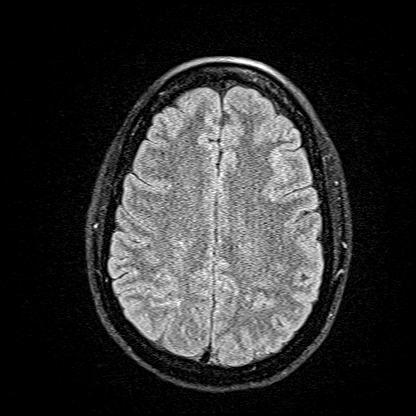
[im 55/55]
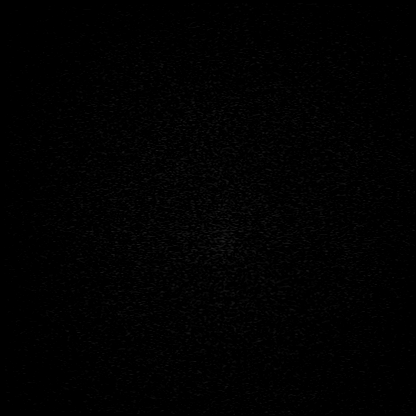

[Series 16: T1 · axial · 1.0mm · 0.98mm/px · z∈[-145,-4]mm · 10 of 144 slices shown (2 of 2)]
[im 1/144]
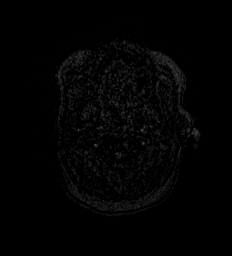
[im 16/144]
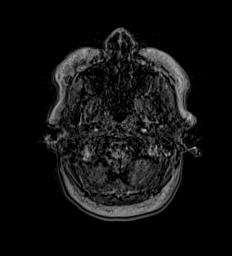
[im 32/144]
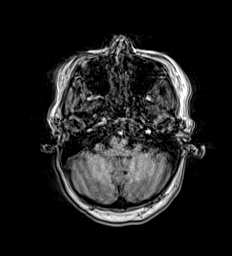
[im 48/144]
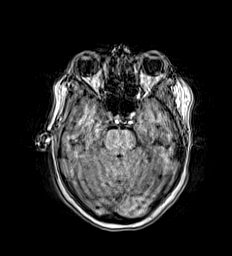
[im 64/144]
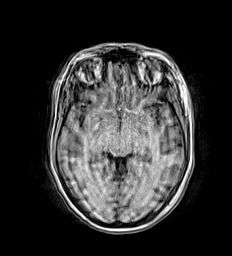
[im 80/144]
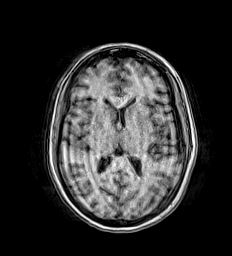
[im 96/144]
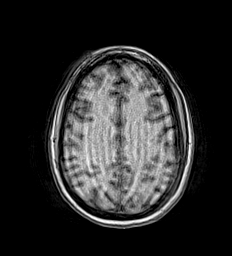
[im 112/144]
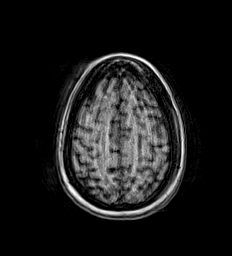
[im 128/144]
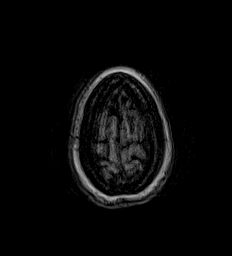
[im 144/144]
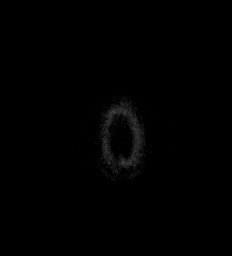

[48 of 48 positions shown; findings below may reference images not displayed]

FINDINGS: Brain: There is no evidence of acute intracranial hemorrhage,
extra-axial fluid collection, or acute infarct.

Parenchymal volume is normal. The ventricles are normal in size.
Gray-white differentiation is preserved. There is a small remote
cortical infarct in the high right parietal lobe and a small remote
infarct in the left cerebellar hemisphere. Scattered additional
small foci of FLAIR signal abnormality in the subcortical white
matter likely reflects sequela of underlying white matter
microangiopathy.

There is no mass lesion.  There is no mass effect or midline shift.

Vascular: The major intracranial flow voids are normal.

Skull and upper cervical spine: Normal marrow signal.

Sinuses/Orbits: There is mild mucosal thickening in the paranasal
sinuses. The globes and orbits are unremarkable.

Other: None.
IMPRESSION: 1. No acute intracranial pathology.
2. Small remote infarcts in the right parietal lobe cortex and left
cerebellar hemisphere and background mild chronic white matter
microangiopathy.

## 2022-02-25 IMAGING — MR MR CERVICAL SPINE W/O CM
5 series · 38 of 48 positions shown · non-contrast
Comparison: Same-day CTA neck

CLINICAL DATA: History of migraines presenting with right-sided
numbness and aphasia

EXAM:
MRI CERVICAL SPINE WITHOUT CONTRAST
TECHNIQUE: Multiplanar, multisequence MR imaging of the cervical spine was
performed. No intravenous contrast was administered.

[Series 6: T2 · sagittal · 3.0mm · 0.62mm/px · 7 of 13 slices shown (1 of 2)]
[im 1/13]
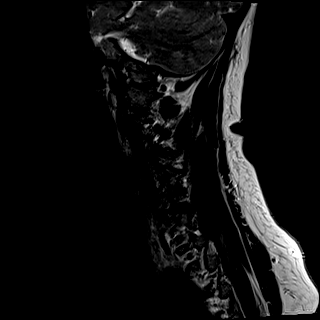
[im 3/13]
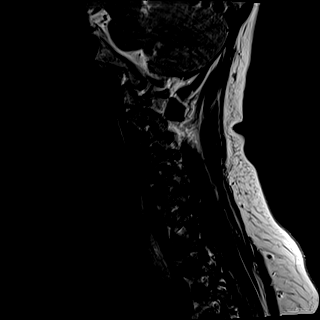
[im 5/13]
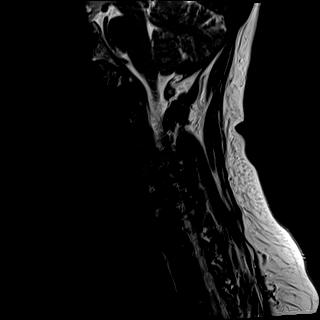
[im 7/13]
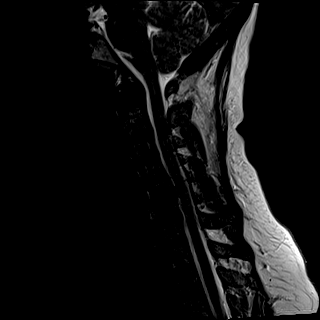
[im 9/13]
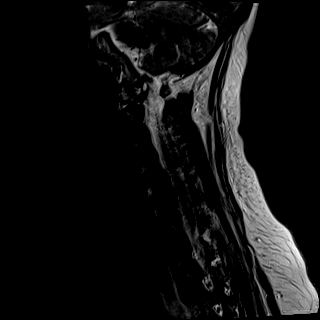
[im 11/13]
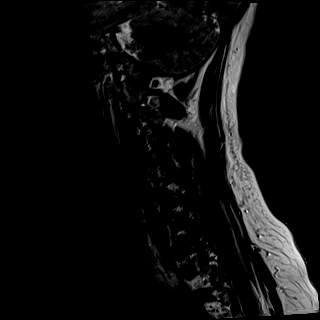
[im 13/13]
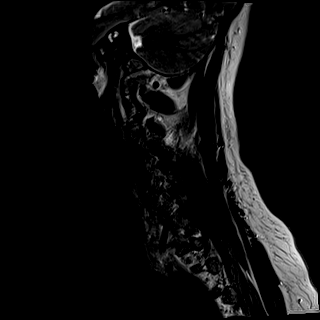

[Series 7: FLAIR · sagittal · 3.0mm · 0.78mm/px · 7 of 13 slices shown]
[im 1/13]
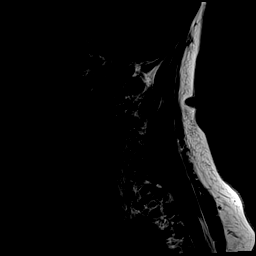
[im 3/13]
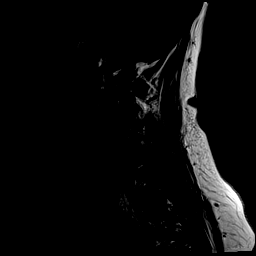
[im 5/13]
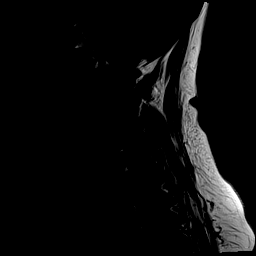
[im 7/13]
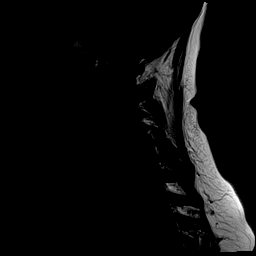
[im 9/13]
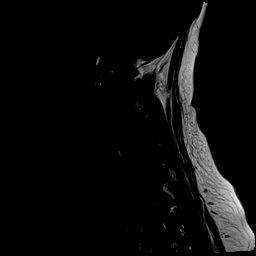
[im 11/13]
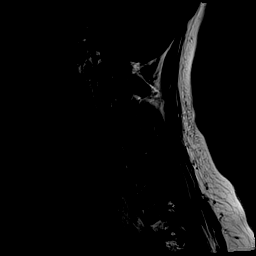
[im 13/13]
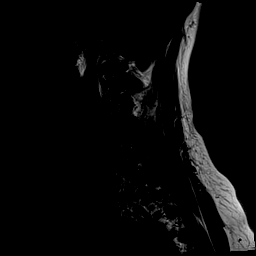

[Series 8: STIR · sagittal · 3.0mm · 0.62mm/px · 6 of 13 slices shown]
[im 1/13]
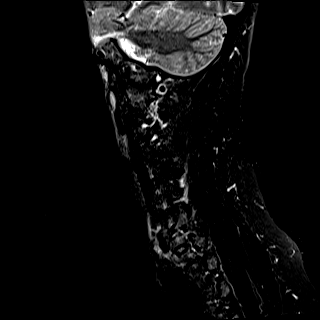
[im 3/13]
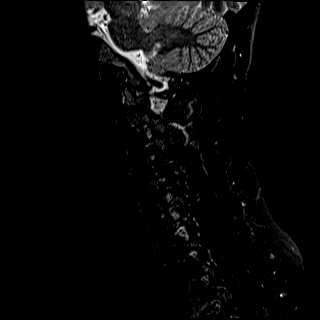
[im 5/13]
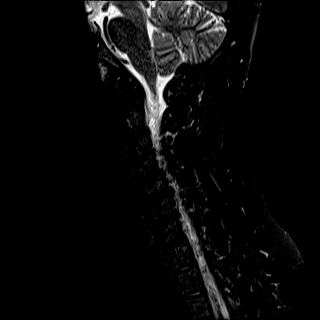
[im 8/13]
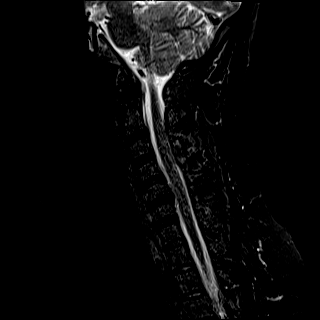
[im 10/13]
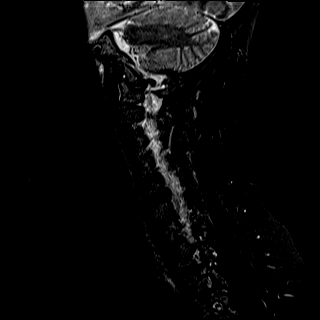
[im 13/13]
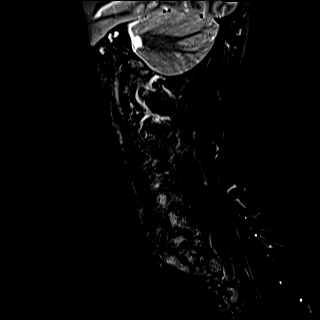

[Series 9: T2 · axial · 3.0mm · 0.70mm/px · z∈[-245,-150]mm · 10 of 29 slices shown (2 of 2)]
[im 1/29]
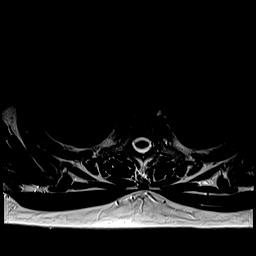
[im 3/29]
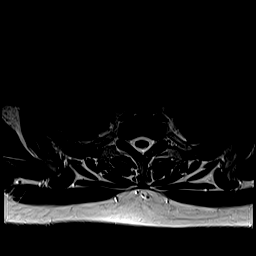
[im 5/29]
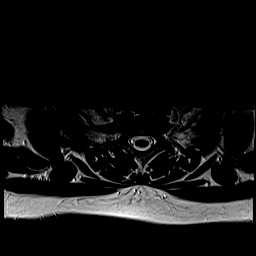
[im 7/29]
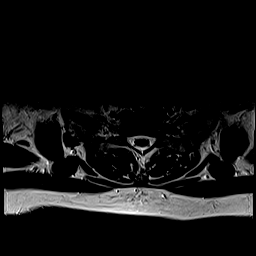
[im 9/29]
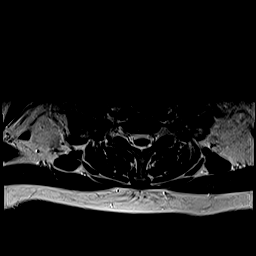
[im 13/29]
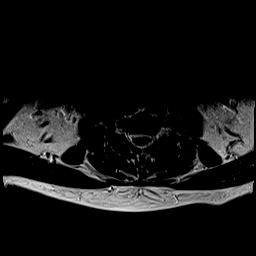
[im 16/29]
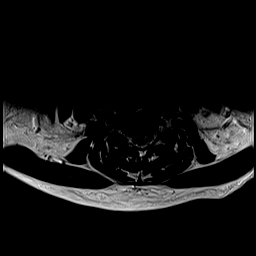
[im 20/29]
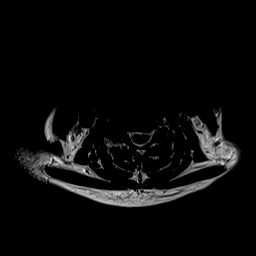
[im 24/29]
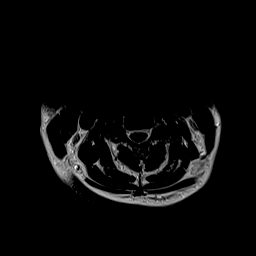
[im 29/29]
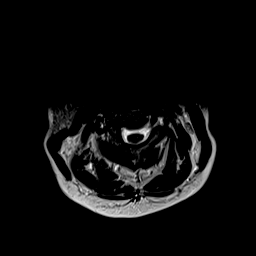

[Series 10: ax mpgr · axial · 3.0mm · 0.35mm/px · z∈[-245,-150]mm · 8 of 29 slices shown]
[im 1/29]
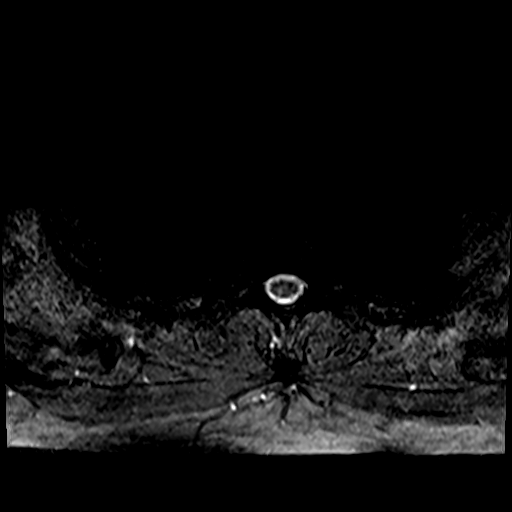
[im 5/29]
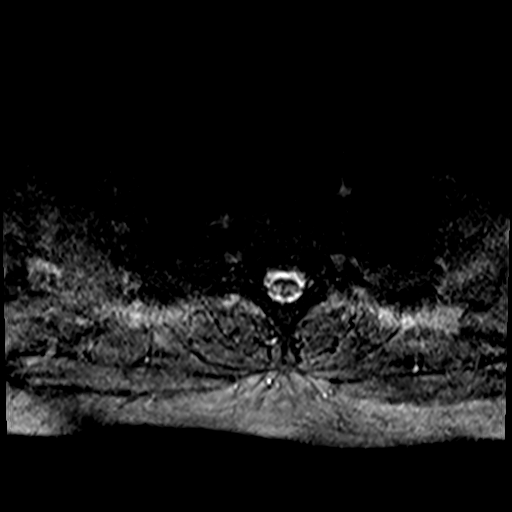
[im 9/29]
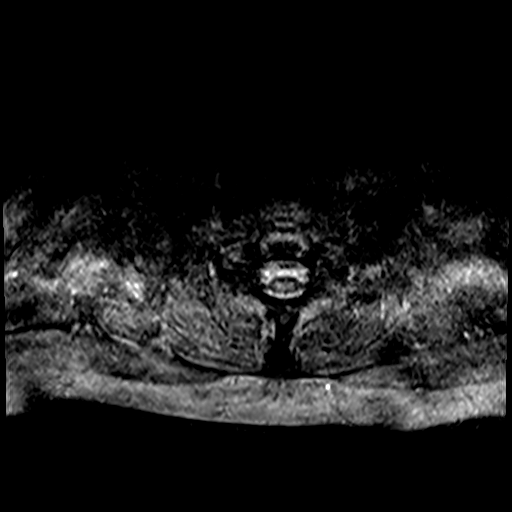
[im 13/29]
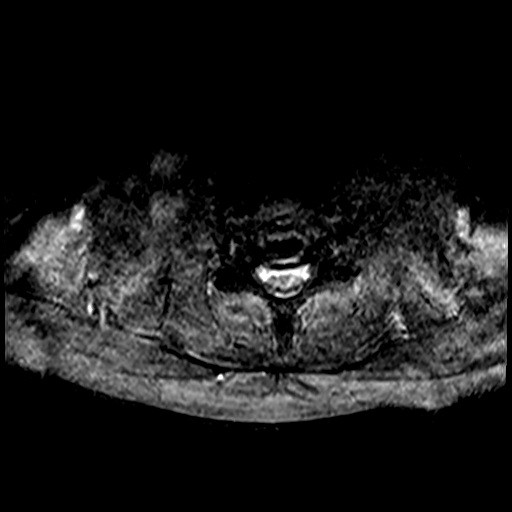
[im 16/29]
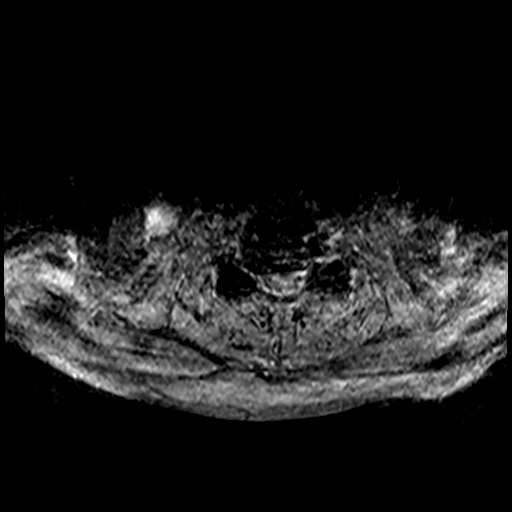
[im 20/29]
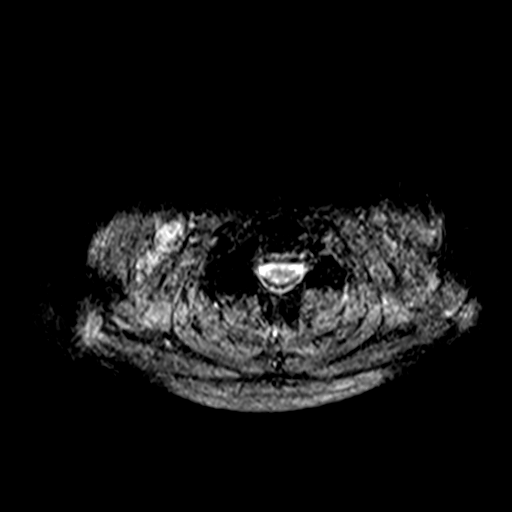
[im 24/29]
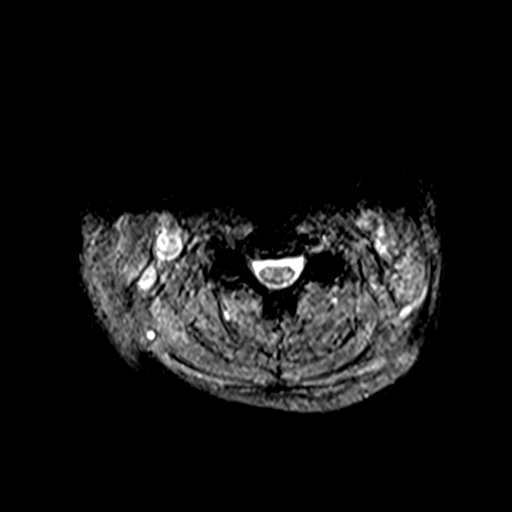
[im 29/29]
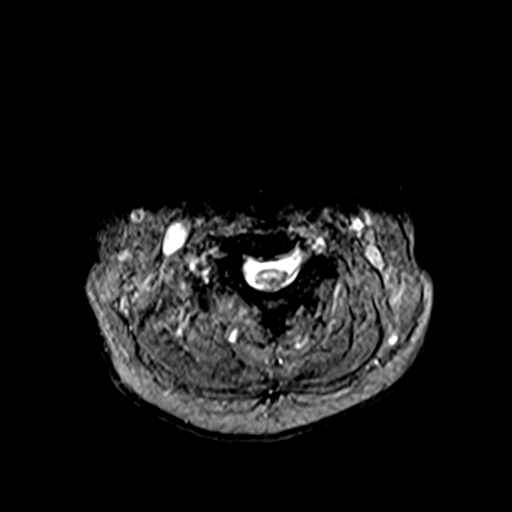

[38 of 48 positions shown; findings below may reference images not displayed]

FINDINGS: Alignment: There is straightening of the normal cervical lordosis.
There is trace anterolisthesis of C4 on C5 and trace retrolisthesis
of C5 on C6.

Vertebrae: Vertebral body heights are preserved. Background marrow
signal is normal. There is no suspicious marrow signal abnormality
or marrow edema.

Cord: Normal in signal and morphology.

Posterior Fossa, vertebral arteries, paraspinal tissues: The imaged
posterior fossa is unremarkable. The vertebral artery flow voids are
normal. The paraspinal soft tissues are normal.

Disc levels:

C2-C3: No significant spinal canal or neural foraminal stenosis

C3-C4: No significant spinal canal or neural foraminal stenosis.

C4-C5: There is prominent right uncovertebral ridging and right
worse than left facet arthropathy resulting in severe right neural
foraminal stenosis. No significant spinal canal or left neural
foraminal stenosis

C5-C6: There is disc desiccation with a mild posterior disc
osteophyte complex and right worse than left uncovertebral ridging
resulting in moderate to severe right neural foraminal stenosis. No
significant spinal canal or left neural foraminal stenosis.

C6-C7: No significant spinal canal or neural foraminal stenosis

C7-T1: No significant spinal canal or neural foraminal stenosis.
IMPRESSION: Severe right neural foraminal stenosis at C4-C5 and moderate to
severe right neural foraminal stenosis at C5-C6 primarily due to
uncovertebral ridging at both levels and facet arthropathy at C4-C5.
No acute finding or significant spinal canal or left neural
foraminal stenosis.

## 2022-02-25 MED ORDER — ATORVASTATIN CALCIUM 20 MG PO TABS
80.0000 mg | ORAL_TABLET | Freq: Every day | ORAL | Status: DC
Start: 2022-02-25 — End: 2022-02-26
  Administered 2022-02-25 – 2022-02-26 (×2): 80 mg via ORAL
  Filled 2022-02-25 (×2): qty 4

## 2022-02-25 MED ORDER — CLOPIDOGREL BISULFATE 75 MG PO TABS
75.0000 mg | ORAL_TABLET | Freq: Every day | ORAL | Status: DC
  Administered 2022-02-26: 75 mg via ORAL
  Filled 2022-02-25: qty 1

## 2022-02-25 MED ORDER — SODIUM CHLORIDE 0.9% FLUSH: 3.0000 mL | Freq: Once | INTRAVENOUS | Status: DC

## 2022-02-25 MED ORDER — NICOTINE 21 MG/24HR TD PT24
21.0000 mg | MEDICATED_PATCH | Freq: Every day | TRANSDERMAL | Status: DC
  Administered 2022-02-25 – 2022-02-26 (×2): 21 mg via TRANSDERMAL
  Filled 2022-02-25 (×2): qty 1

## 2022-02-25 MED ORDER — ACETAMINOPHEN 650 MG RE SUPP: 650.0000 mg | RECTAL | Status: DC | PRN

## 2022-02-25 MED ORDER — ACETAMINOPHEN 160 MG/5ML PO SOLN: 650.0000 mg | ORAL | Status: DC | PRN

## 2022-02-25 MED ORDER — ALBUTEROL SULFATE (2.5 MG/3ML) 0.083% IN NEBU: 3.0000 mL | INHALATION_SOLUTION | Freq: Four times a day (QID) | RESPIRATORY_TRACT | Status: DC | PRN

## 2022-02-25 MED ORDER — STROKE: EARLY STAGES OF RECOVERY BOOK: Freq: Once | Status: AC

## 2022-02-25 MED ORDER — ACETAMINOPHEN 325 MG PO TABS: 650.0000 mg | ORAL_TABLET | ORAL | Status: DC | PRN

## 2022-02-25 MED ORDER — ASPIRIN 81 MG PO TBEC
81.0000 mg | DELAYED_RELEASE_TABLET | Freq: Every day | ORAL | Status: DC
  Administered 2022-02-26: 81 mg via ORAL
  Filled 2022-02-25: qty 1

## 2022-02-25 MED ORDER — IOHEXOL 350 MG/ML SOLN
100.0000 mL | Freq: Once | INTRAVENOUS | Status: AC | PRN
  Administered 2022-02-25: 100 mL via INTRAVENOUS

## 2022-02-25 MED ORDER — SODIUM CHLORIDE 0.9 % IV BOLUS
1000.0000 mL | Freq: Once | INTRAVENOUS | Status: AC
  Administered 2022-02-25: 1000 mL via INTRAVENOUS

## 2022-02-25 MED ORDER — CLOPIDOGREL BISULFATE 75 MG PO TABS
300.0000 mg | ORAL_TABLET | Freq: Once | ORAL | Status: AC
  Administered 2022-02-25: 300 mg via ORAL
  Filled 2022-02-25: qty 4

## 2022-02-25 NOTE — Consult Note (Signed)
Neurology Consultation Reason for Consult: Aphasia with right-sided numbness Referring Physician: Ellender Hose, C  CC: Difficulty speaking  History is obtained from: Patient  HPI: Michele Kim is a 46 y.o. female with a history of migraines who presents with right-sided numbness and aphasia.  She states that she woke up this morning with right arm numbness.  She denies any tingling or paresthesia.  This is persistent, then she went outside and smoke a cigarette and on coming back she laid down for a few minutes.  When her alarm next went off, she was unable to speak.  She remembers the entire episode, and states that she just was not able to get her words out.    LKW: 6/21 prior to bed tpa given?: no, outside of window Premorbid modified rankin scale: 0   Past Medical History:  Diagnosis Date   Diverticulosis    Frequent headaches    H/O   Gastric ulcer    Gastritis    Hair loss    Hyperplastic colon polyp    IBS (irritable bowel syndrome)    Miscarriage    x 2   Vitamin D deficiency      Family History  Problem Relation Age of Onset   Breast cancer Mother    Hyperlipidemia Mother    Hypertension Mother    Heart disease Father    Hypertension Father    Lung cancer Maternal Uncle    Heart disease Maternal Grandmother    Hypertension Maternal Grandmother    Diabetes Maternal Grandmother    Alcohol abuse Maternal Grandfather    Stroke Maternal Grandfather    Stroke Paternal Grandfather    Alcohol abuse Paternal Grandfather    Lung disease Paternal Grandfather    Heart disease Paternal Grandfather    Anxiety disorder Brother    Breast cancer Maternal Aunt    Heart disease Paternal Aunt    Heart disease Paternal Aunt    Heart disease Paternal Aunt    Diabetes Maternal Aunt    Colon cancer Neg Hx      Social History:  reports that she has been smoking cigarettes. She has been smoking an average of 1 pack per day. She has never used smokeless tobacco. She reports  current alcohol use. She reports that she does not use drugs.   Exam: Current vital signs: BP 107/75   Pulse 69   Temp 98.4 F (36.9 C) (Oral)   Resp 13   Ht 5' (1.524 m)   Wt 61.2 kg   SpO2 96%   BMI 26.37 kg/m  Vital signs in last 24 hours: Temp:  [98.4 F (36.9 C)] 98.4 F (36.9 C) (06/22 0921) Pulse Rate:  [66-82] 69 (06/22 0955) Resp:  [13-16] 13 (06/22 0955) BP: (107-123)/(75-109) 107/75 (06/22 0930) SpO2:  [96 %-99 %] 96 % (06/22 0955) Weight:  [61.2 kg] 61.2 kg (06/22 0916)   Physical Exam  Constitutional: Appears well-developed and well-nourished.  Psych: Affect appropriate to situation Eyes: No scleral injection HENT: No OP obstruction MSK: no joint deformities.  Cardiovascular: Normal rate and regular rhythm.  Respiratory: Effort normal, non-labored breathing GI: Soft.  No distension. There is no tenderness.  Skin: WDI  Neuro: Mental Status: Patient is awake, alert, oriented to person, place, month, year, and situation.Patient is able to give a clear and coherent history. She is able to name objects, but has mild difficulty with repetition. Cranial Nerves: II: Visual Fields are full. Pupils are equal, round, and reactive to light.  III,IV, VI: EOMI without ptosis or diploplia.  V: Facial sensation is symmetric to temperature VII: Facial movement is symmetric.  VIII: hearing is intact to voice X: Uvula elevates symmetrically XI: Shoulder shrug is symmetric. XII: tongue is midline without atrophy or fasciculations.  Motor: Tone is normal. Bulk is normal. 5/5 strength was present in all four extremities.  Sensory: Sensation is diminished in the right arm Cerebellar: Initially she has slow movements on finger-nose-finger on the right, but this improves over the course of the exam.  No ataxia     I have reviewed labs in epic and the results pertinent to this consultation are: Cbg 131   I have reviewed the images obtained:CTA - no  lov  Impression: 46 yo F with improving right sided numbness and aphasia. I suspect that this repersents tia or mild improving stroke. I think migraine is less likely given her described semiology. She will need to be admitted for secondary risk factor assessment. She takes [facility] powder daily and took two today already.   Recommendations: - HgbA1c, fasting lipid panel - MRI of the brain without contrast - Frequent neuro checks - Echocardiogram - Prophylactic therapy-Antiplatelet med: Aspirin - dose '81mg'$  and plavix '75mg'$  daily  after '300mg'$  load  - Risk factor modification - Telemetry monitoring - PT consult, OT consult, Speech consult   Roland Rack, MD [facility] [phone]  If 7pm- 7am, please page neurology on call as listed in AMION.

## 2022-02-25 NOTE — Code Documentation (Signed)
Stroke Response Nurse Documentation Code Documentation  Michele Kim is a 46 y.o. female arriving to [facility] via [facility] on 02/25/2022 with past medical hx of smoking. Code stroke was activated by ED.   Patient from home where she was LKW at [place] this morning when she went to bed and now complaining of right sided numbness and trouble getting her words out. Pt reports waking up to have a cigarette at 0824 when she noted her right arm felt numb. She went back to bed and when she woke at 0834 her husband noted she had trouble speaking and right sided weakness.   Stroke team at the bedside on patient arrival. Labs drawn and patient cleared for CT.  Patient to CT with team. NIHSS 3, see documentation for details and code stroke times. Patient with right facial droop, right leg weakness, right decreased sensation, and Expressive aphasia  on exam. The following imaging was completed:  CT Head, CTA, and CTP. Patient is not a candidate for IV Thrombolytic due to being outside window. Patient is not a candidate for IR due to no LVO per imaging by MD Leonel Ramsay.   Care Plan: q2 NIHSS/VS and stroke workup.   Bedside handoff with ED RN Candice.    Kathrin Greathouse  Stroke Response RN

## 2022-02-25 NOTE — Evaluation (Signed)
Physical Therapy Evaluation Patient Details Name: Michele Kim MRN: [id number] DOB: 04/16/1976 Today's Date: 02/25/2022  History of Present Illness  Michele Kim is a 35yoF who [facility] due to concerns of stroke- pt struggling with word finding issues that are new. PMH: gastritis, HTN, nicotine dependence. Per MRI report- No acute intracranial pathology- Small remote  infarcts in the right parietal lobe cortex and left cerebellar hemisphere and background mild chronic white matter microangiopathy.  Clinical Impression  Pt admitted with above diagnosis. Pt currently with functional limitations due to the deficits listed below (see "PT Problem List"). The pt is alert, pleasant, interactive, and able to provide info regarding prior level of function. Pt currently at her baseline for all mobility assessed. Pt denies any acute changes to balance, transferring, walking. Pt reports no new loss of strength or sensation in legs or arms. Patient is at baseline, all education completed, and time is given to address all questions/concerns. Pt educated on mobility strategies while admitted to reduce risk of PNA, PE, DVT. No additional skilled PT services needed at this time, PT signing off. PT recommends daily ambulation ad lib or with nursing staff as needed to prevent deconditioning.    No data found.      Recommendations for follow up therapy are one component of a multi-disciplinary discharge planning process, led by the attending physician.  Recommendations may be updated based on patient status, additional functional criteria and insurance authorization.  Follow Up Recommendations No PT follow up      Assistance Recommended at Discharge None  Patient can return home with the following       Equipment Recommendations None recommended by PT  Recommendations for Other Services       Functional Status Assessment Patient has not had a recent decline in their functional status      Precautions / Restrictions Precautions Precautions: None Restrictions Weight Bearing Restrictions: No      Mobility  Bed Mobility Overal bed mobility: Independent                  Transfers Overall transfer level: Independent                      Ambulation/Gait Ambulation/Gait assistance: Independent Gait Distance (Feet): 70 Feet Assistive device: None            Stairs            Wheelchair Mobility    Modified Rankin (Stroke Patients Only)       Balance Overall balance assessment: Independent                                           Pertinent Vitals/Pain Pain Assessment Pain Assessment: No/denies pain    Home Living Family/patient expects to be discharged to:: Private residence Living Arrangements: Spouse/significant other Available Help at Discharge: Family             Home Equipment: None      Prior Function Prior Level of Function : Independent/Modified Independent;Driving                     Hand Dominance        Extremity/Trunk Assessment                Communication      Cognition Arousal/Alertness: Awake/alert Behavior  During Therapy: WFL for tasks assessed/performed Overall Cognitive Status: Within Functional Limits for tasks assessed                                          General Comments      Exercises     Assessment/Plan    PT Assessment Patient does not need any further PT services  PT Problem List         PT Treatment Interventions      PT Goals (Current goals can be found in the Care Plan section)  Acute Rehab PT Goals PT Goal Formulation: All assessment and education complete, DC therapy    Frequency       Co-evaluation               AM-PAC PT "6 Clicks" Mobility  Outcome Measure Help needed turning from your back to your side while in a flat bed without using bedrails?: None Help needed moving from lying on your back  to sitting on the side of a flat bed without using bedrails?: None Help needed moving to and from a bed to a chair (including a wheelchair)?: None Help needed standing up from a chair using your arms (e.g., wheelchair or bedside chair)?: None Help needed to walk in hospital room?: None Help needed climbing 3-5 steps with a railing? : None 6 Click Score: 24    End of Session   Activity Tolerance: Patient tolerated treatment well;No increased pain Patient left: in bed;with family/visitor present Nurse Communication: Mobility status PT Visit Diagnosis: Other symptoms and signs involving the nervous system (R29.898)    Time: 4982-6415 PT Time Calculation (min) (ACUTE ONLY): 9 min   Charges:   PT Evaluation $PT Eval Low Complexity: 1 Low         2:31 PM, 02/25/22 Etta Grandchild, PT, DPT Physical Therapist - [facility]  [phone] ([place])    [place] C 02/25/2022, 2:29 PM

## 2022-02-25 NOTE — ED Notes (Signed)
Pt ambulated to restroom and back with no difficulties.  Back in bed and placed back on monitor.

## 2022-02-25 NOTE — Assessment & Plan Note (Addendum)
Patient presents to the ER for evaluation of word finding difficulty and transient aphasia all of which have resolved. Initial CT scan of the head without contrast was negative for hemorrhage but showed old areas of infarction. Obtain MRI of the brain without contrast Obtain 2D echocardiogram to assess LVEF and rule out cardiac thrombus Place patient on aspirin, Plavix and high intensity statins We will consult neurology PT/OT/ST consult Allow for permissive hypertension

## 2022-02-25 NOTE — Progress Notes (Signed)
OT Cancellation Note  Patient Details Name: Michele Kim MRN: [id number] DOB: 1976-03-02   Cancelled Treatment:    Reason Eval/Treat Not Completed: OT screened, no needs identified, will sign off;Other (comment) (Per pt at baseline for ADL status, no OT needs identified at this time. OT will sign off, please reconsult if change in status).  [employer], OTDS  [employer] 02/25/2022, 2:30 PM

## 2022-02-25 NOTE — ED Triage Notes (Signed)
Pt comes into the ED, states around 845am the pt sudden had issues speaking, word garbage, no noted droop. States hard to grip or hold things with the right hand. Pt is alert and ambulatory on arrival

## 2022-02-25 NOTE — ED Notes (Signed)
Pt in CT again with primary RN and neurologist. Family at bedside.

## 2022-02-25 NOTE — ED Notes (Signed)
Informed rn bed assigned 

## 2022-02-25 NOTE — Progress Notes (Signed)
   02/25/22 1000  Clinical Encounter Type  Visited With Patient and family together  Visit Type Initial;Code  Consult/Referral To Chaplain  Spiritual Encounters  Spiritual Needs Prayer;Emotional   Chaplain responded to Code Stroke and provided support to patient and family.

## 2022-02-25 NOTE — ED Notes (Signed)
Called Code Stroke to [facility] at [place]

## 2022-02-25 NOTE — Progress Notes (Signed)
Code Stroke Activated @ [id number].  [employer] @ 0930, on camera @ [phone].  NCCT completed before activation.  Pt returned from CT @ 0930.  No TNK per Leonel Ramsay, sending back for advanced imaging.

## 2022-02-25 NOTE — Progress Notes (Signed)
CODE STROKE- PHARMACY COMMUNICATION   Time CODE STROKE called/page received:0932  Time response to CODE STROKE was made: 0941 in person:   Time Stroke Kit retrieved from Pyx[facility]: outside of window  Name of Provider/Nurse contacted:Dr Claybon Jabs  Past Medical History:  Diagnosis Date   Diverticulosis    Frequent headaches    H/O   Gastric ulcer    Gastritis    Hair loss    Hyperplastic colon polyp    IBS (irritable bowel syndrome)    Miscarriage    x 2   Vitamin D deficiency    Prior to Admission medications   Medication Sig Start Date End Date Taking? Authorizing Provider  amLODipine (NORVASC) 2.5 MG tablet Take 1 tablet (2.5 mg total) by mouth in the morning. 09/12/20   Rodriguez-Southworth, Sunday Spillers, PA-C  DIPHENHYDRAMINE HCL PO Take 10 mLs by mouth 4 (four) times daily as needed. Magic mouth wash    [provider]  doxycycline (VIBRAMYCIN) 100 MG capsule Take 1 capsule (100 mg total) by mouth 2 (two) times daily. 09/12/20   Rodriguez-Southworth, Sunday Spillers, PA-C  fluconazole (DIFLUCAN) 150 MG tablet Take 1 tablet (150 mg total) by mouth daily. 09/12/20   Rodriguez-Southworth, Sunday Spillers, PA-C  methocarbamol (ROBAXIN) 500 MG tablet Take 0.5-1 tablets (250-500 mg total) by mouth at bedtime. 09/07/17   Mar Daring, PA-C  nortriptyline (PAMELOR) 10 MG capsule TAKE 1 CAPSULE (10 MG TOTAL) BY MOUTH AT BEDTIME. 12/19/17   Burnette, Clearnce Sorrel, PA-C  pantoprazole (PROTONIX) 40 MG tablet Take 1 tablet (40 mg total) by mouth daily. 08/04/17   Pyrtle, Lajuan Lines, MD  hydrochlorothiazide (MICROZIDE) 12.5 MG capsule Take 1 capsule (12.5 mg total) by mouth daily. 09/07/17 09/12/20  Mar Daring, PA-C  promethazine (PHENERGAN) 25 MG tablet Take 1 tablet (25 mg total) every 6 (six) hours as needed by mouth for nausea or vomiting. Patient not taking: No sig reported 07/22/17 09/12/20  Norval Gable, MD    Dallie Piles ,PharmD Clinical Pharmacist  02/25/2022  9:33 AM

## 2022-02-25 NOTE — Progress Notes (Signed)
Admission profile updated. ?

## 2022-02-26 ENCOUNTER — Observation Stay
Admit: 2022-02-26 | Discharge: 2022-02-26 | Disposition: A | Discharge status: Home / Self Care | Payer: Self-pay | Attending: Internal Medicine | Admitting: Internal Medicine

## 2022-02-26 LAB — ECHOCARDIOGRAM COMPLETE
AR max vel: 1.39 cm2
AV Area VTI: 1.52 cm2
AV Area mean vel: 1.39 cm2
AV Mean grad: 4 mmHg
AV Peak grad: 8.3 mmHg
Ao pk vel: 1.44 m/s
Area-P 1/2: 3.95 cm2
Height: 62 in
S' Lateral: 3.2 cm
Weight: 2099.2 oz

## 2022-02-26 LAB — LIPID PANEL
Cholesterol: 128 mg/dL (ref 0–200)
HDL: 45 mg/dL (ref >40)
LDL Cholesterol: 68 mg/dL (ref 0–99)
Total CHOL/HDL Ratio: 2.8 RATIO
Triglycerides: 74 mg/dL (ref <150)
VLDL: 15 mg/dL (ref 0–40)

## 2022-02-26 LAB — HIV ANTIBODY (ROUTINE TESTING W REFLEX): HIV Screen 4th Generation wRfx: NONREACTIVE

## 2022-02-26 MED ORDER — ASPIRIN 81 MG PO TBEC: 81.0000 mg | DELAYED_RELEASE_TABLET | Freq: Every day | ORAL | 0 refills | Status: AC

## 2022-02-26 MED ORDER — ATORVASTATIN CALCIUM 40 MG PO TABS: 20.0000 mg | ORAL_TABLET | Freq: Every day | ORAL | 0 refills | Status: DC

## 2022-02-26 MED ORDER — CLOPIDOGREL BISULFATE 75 MG PO TABS: 75.0000 mg | ORAL_TABLET | Freq: Every day | ORAL | 0 refills | Status: AC

## 2022-02-26 MED ORDER — ATORVASTATIN CALCIUM 10 MG PO TABS
10.0000 mg | ORAL_TABLET | Freq: Every day | ORAL | 0 refills | Status: DC
Start: 2022-02-27 — End: 2022-09-08

## 2022-02-26 MED ORDER — NICOTINE 21 MG/24HR TD PT24: 21.0000 mg | MEDICATED_PATCH | Freq: Every day | TRANSDERMAL | 0 refills | Status: AC

## 2022-02-26 MED ORDER — ATORVASTATIN CALCIUM 20 MG PO TABS: 10.0000 mg | ORAL_TABLET | Freq: Every day | ORAL | Status: DC

## 2022-02-26 MED ORDER — ATORVASTATIN CALCIUM 20 MG PO TABS: 40.0000 mg | ORAL_TABLET | Freq: Every day | ORAL | Status: DC

## 2022-02-26 NOTE — Progress Notes (Addendum)
SLP Cancellation Note  Patient Details Name: Michele Kim MRN: [id number] DOB: May 01, 1976   Cancelled treatment:       Reason Eval/Treat Not Completed: SLP screened, no needs identified, will sign off (chart reviewed; consulted NSG then met w/ pt/husband in room) Pt denied any difficulty swallowing and is currently on a regular diet; tolerates swallowing pills w/ water per NSG. Pt conversed in general conversation w/out overt expressive/receptive deficits noted; pt denied any speech-language deficits. Husband agreed stating "she's feeling much better now". Speech clear when awake/sitting up, though pt was min drowsy and tended to drift off to sleep. She was able to converse about the meals she has had, and favorite foods. Noted MRI results. No further skilled ST services indicated as pt appears at her baseline. Pt agreed. NSG to reconsult if any change in status while admitted.        Jerilynn Som, MS, CCC-SLP Speech Language Pathologist Rehab Services; [facility] [phone] (ascom) Pixie Burgener 02/26/2022, 9:01 AM

## 2022-02-26 NOTE — Progress Notes (Signed)
Subjective: Feels back to normal  Exam: Vitals:   02/26/22 0535 02/26/22 0825  BP: (!) 112/54 111/71  Pulse: (!) 56 (!) 59  Resp: 18 18  Temp: 97.9 F (36.6 C) 98 F (36.7 C)  SpO2: 98% 97%   Gen: In bed, NAD Resp: non-labored breathing, no acute distress Abd: soft, nt  Neuro: MS: Awake, alert, interactive and appropriate, no aphasia CN: Visual fields full, EOMI Motor: No drift, good strength Sensory: Intact to light touch  Pertinent Labs: LDL 68 A1c 5.9  Impression: 46 year old female who presents with transient right-arm numbness and aphasia.  She does have some neuroforaminal stenosis on MRI of her cervical spine, but this would not be expected to contribute to her aphasia.  Though she does not have acute stroke she has two old appearing embolic strokes found on her MRI.  She has some narrowing of her ICAs bilaterally and therefore though her LDL is within what we typically think of as a goal, I would favor low-dose statin e.g. 10 mg Lipitor.  She has a strong family history of statin intolerance, which is another reason for not using a high intensity statin.  Her narrowing is not definitely atherogenic, however, and it could be that she continues to narrow developing a moyamoya type picture, so she will need repeat imaging at a later date.  She does endorse previous episodes of palpitations, and I think that a prolonged cardiac monitor is indicated.  I would favor a implanted loop recorder, but Zio patch would also be reasonable.  I would also favor further evaluation for PFO with TEE, but this does not need to be done as an inpatient.  Recommendations: 1) TEE, prolonged cardiac monitoring, this can be done as an outpatient 2) continue aspirin and Plavix x3 weeks followed by aspirin monotherapy 3) she will need outpatient neurology follow-up, she wishes to follow-up with [facility] clinic. 4) lipitor 10mg  qhs  5) neurology will be available on an as-needed basis.  Ritta Slot, MD [facility] [phone]  If 7pm- 7am, please page neurology on call as listed in AMION.

## 2022-02-26 NOTE — Progress Notes (Signed)
*  PRELIMINARY RESULTS* Echocardiogram 2D Echocardiogram has been performed.  Michele Kim 02/26/2022, 8:21 AM

## 2022-04-23 ENCOUNTER — Ambulatory Visit
Admission: EM | Admit: 2022-04-23 | Discharge: 2022-04-23 | Disposition: A | Discharge status: Home / Self Care | Payer: Self-pay | Attending: Emergency Medicine | Admitting: Emergency Medicine

## 2022-04-23 DIAGNOSIS — Z8673 Personal history of transient ischemic attack (TIA), and cerebral infarction without residual deficits: Secondary | ICD-10-CM | POA: Insufficient documentation

## 2022-04-23 DIAGNOSIS — H109 Unspecified conjunctivitis: Secondary | ICD-10-CM | POA: Insufficient documentation

## 2022-04-23 DIAGNOSIS — Z20822 Contact with and (suspected) exposure to covid-19: Secondary | ICD-10-CM | POA: Insufficient documentation

## 2022-04-23 DIAGNOSIS — B9689 Other specified bacterial agents as the cause of diseases classified elsewhere: Secondary | ICD-10-CM | POA: Insufficient documentation

## 2022-04-23 DIAGNOSIS — J069 Acute upper respiratory infection, unspecified: Secondary | ICD-10-CM | POA: Insufficient documentation

## 2022-04-23 LAB — GROUP A STREP BY PCR: Group A Strep by PCR: NOT DETECTED

## 2022-04-23 LAB — SARS CORONAVIRUS 2 BY RT PCR: SARS Coronavirus 2 by RT PCR: NEGATIVE

## 2022-04-23 MED ORDER — POLYMYXIN B-TRIMETHOPRIM 10000-0.1 UNIT/ML-% OP SOLN: 1.0000 [drp] | OPHTHALMIC | 0 refills | Status: AC

## 2022-04-23 NOTE — ED Triage Notes (Signed)
Pt c/o left eye swelling, pain, and brown discharge from left eye x2days  Pt has taken OTC eye drops and sinus medication and it did not help.   Pt states that both eyes now hurt and are swollen.   Pt denies getting anything in her eye but states that her eyes are dark red under her eyelid.

## 2022-04-23 NOTE — Discharge Instructions (Signed)
Your symptoms today are most likely being caused by a virus and should steadily improve in time it can take up to 7 to 10 days before you truly start to see a turnaround however things will get better  COVID and strep testing negative    You can take Tylenol and/or Ibuprofen as needed for fever reduction and pain relief.   For cough: honey 1/2 to 1 teaspoon (you can dilute the honey in water or another fluid).  You can also use guaifenesin and dextromethorphan for cough. You can use a humidifier for chest congestion and cough.  If you don't have a humidifier, you can sit in the bathroom with the hot shower running.      For sore throat: try warm salt water gargles, cepacol lozenges, throat spray, warm tea or water with lemon/honey, popsicles or ice, or OTC cold relief medicine for throat discomfort.   For congestion: take a daily anti-histamine like Zyrtec, Claritin, and a oral decongestant, such as pseudoephedrine.  You can also use Flonase 1-2 sprays in each nostril daily.   It is important to stay hydrated: drink plenty of fluids (water, gatorade/powerade/pedialyte, juices, or teas) to keep your throat moisturized and help further relieve irritation/discomfort.     Today you being treated for bacterial conjunctivitis.   Place one drop of polytrim into the effected eye every 4 hours while awake for 7 days. If the other eye starts to have symptoms you may use medication in it as well. Do not allow tip of dropper to touch eye.  May use cool compress for comfort and to remove discharge if present. Pat the eye, do not wipe.  Do not rub eyes, this may cause more irritation.  May use benadryl as needed to help if itching present.  Please avoid use of eye makeup until symptoms clear.  If symptoms persist after use of medication, please follow up at Urgent Care or with ophthalmologist (eye doctor)

## 2022-04-23 NOTE — ED Provider Notes (Signed)
[facility]    CSN: [id number] Arrival date & time: 04/23/22  1004      History   Chief Complaint Chief Complaint  Patient presents with   Eye Problem    HPI Michele Kim is a 46 y.o. female.   Patient presents with bilateral eye swelling, burning sensation, drainage and redness beginning 1 day ago.  Also endorses sore throat, nasal congestion and a nonproductive cough. , painful to swallow but able to tolerate food and liquids.  No known sick contacts.  Has attempted use of over-the-counter eyedrops and sinus cold and flu medication which has been ineffective.  History of migraine, TIA.  Denies fever, chills, body aches, ear pain, shortness of breath, wheezing, abdominal pain, nausea, vomiting or diarrhea.   Past Medical History:  Diagnosis Date   Diverticulosis    Frequent headaches    H/O   Gastric ulcer    Gastritis    Hair loss    Hyperplastic colon polyp    IBS (irritable bowel syndrome)    Miscarriage    x 2   Vitamin D deficiency     Patient Active Problem List   Diagnosis Date Noted   TIA (transient ischemic attack) 02/25/2022   Nicotine dependence 02/25/2022   Migraine without aura and without status migrainosus, not intractable 08/10/2017   Essential hypertension 08/10/2017   Hair loss 02/05/2014   Miscarriage 02/05/2014   Vitamin D deficiency 02/05/2014   Stress 02/05/2014    Past Surgical History:  Procedure Laterality Date   COLONOSCOPY     miscarriage  2014   UPPER GASTROINTESTINAL ENDOSCOPY      OB History   No obstetric history on file.      Home Medications    Prior to Admission medications   Medication Sig Start Date End Date Taking? Authorizing Provider  amLODipine (NORVASC) 2.5 MG tablet Take 2.5 mg by mouth daily. 04/11/22  Yes [provider]  aspirin EC 81 MG tablet Take 1 tablet (81 mg total) by mouth daily. Swallow whole. 02/27/22 02/22/23 Yes Hall, Carole N, DO  atorvastatin (LIPITOR) 10 MG tablet Take 1  tablet (10 mg total) by mouth daily. 02/27/22 05/28/22 Yes Hall, Carole N, DO  nicotine (NICODERM CQ - DOSED IN MG/24 HOURS) 21 mg/24hr patch Place 1 patch (21 mg total) onto the skin daily. 02/27/22  Yes Kayleen Memos, DO  hydrochlorothiazide (MICROZIDE) 12.5 MG capsule Take 1 capsule (12.5 mg total) by mouth daily. 09/07/17 09/12/20  Mar Daring, PA-C  promethazine (PHENERGAN) 25 MG tablet Take 1 tablet (25 mg total) every 6 (six) hours as needed by mouth for nausea or vomiting. Patient not taking: No sig reported 07/22/17 09/12/20  Norval Gable, MD    Family History Family History  Problem Relation Age of Onset   Breast cancer Mother    Hyperlipidemia Mother    Hypertension Mother    Heart disease Father    Hypertension Father    Lung cancer Maternal Uncle    Heart disease Maternal Grandmother    Hypertension Maternal Grandmother    Diabetes Maternal Grandmother    Alcohol abuse Maternal Grandfather    Stroke Maternal Grandfather    Stroke Paternal Grandfather    Alcohol abuse Paternal Grandfather    Lung disease Paternal Grandfather    Heart disease Paternal Grandfather    Anxiety disorder Brother    Breast cancer Maternal Aunt    Heart disease Paternal Aunt    Heart disease Paternal Aunt  Heart disease Paternal Aunt    Diabetes Maternal Aunt    Colon cancer Neg Hx     Social History Social History   Tobacco Use   Smoking status: Every Day    Packs/day: 1.00    Types: Cigarettes   Smokeless tobacco: Never  Vaping Use   Vaping Use: Every day  Substance Use Topics   Alcohol use: Yes    Alcohol/week: 0.0 standard drinks of alcohol    Comment: occasional   Drug use: No     Allergies   Patient has no known allergies.   Review of Systems Review of Systems  Constitutional: Negative.   HENT:  Positive for congestion, ear pain and sore throat. Negative for dental problem, drooling, ear discharge, facial swelling, hearing loss, mouth sores, nosebleeds,  postnasal drip, rhinorrhea, sinus pressure, sinus pain, sneezing, tinnitus, trouble swallowing and voice change.   Eyes:  Positive for pain, discharge and redness. Negative for photophobia, itching and visual disturbance.  Respiratory:  Positive for cough. Negative for apnea, choking, chest tightness, shortness of breath, wheezing and stridor.   Cardiovascular: Negative.   Gastrointestinal: Negative.   Skin: Negative.      Physical Exam Triage Vital Signs ED Triage Vitals  Enc Vitals Group     BP 04/23/22 1014 124/75     Pulse Rate 04/23/22 1014 76     Resp 04/23/22 1014 18     Temp 04/23/22 1014 98 F (36.7 C)     Temp Source 04/23/22 1014 Oral     SpO2 04/23/22 1014 99 %     Weight 04/23/22 1011 135 lb (61.2 kg)     Height 04/23/22 1011 5' (1.524 m)     Head Circumference --      Peak Flow --      Pain Score 04/23/22 1011 4     Pain Loc --      Pain Edu? --      Excl. in [place]? --    No data found.  Updated Vital Signs BP 124/75 (BP Location: Left Arm)   Pulse 76   Temp 98 F (36.7 C) (Oral)   Resp 18   Ht 5' (1.524 m)   Wt 135 lb (61.2 kg)   LMP 03/13/2022   SpO2 99%   BMI 26.37 kg/m   Visual Acuity Right Eye Distance: 20/50 Left Eye Distance: 20/50 Bilateral Distance: 20/70  Right Eye Near:   Left Eye Near:    Bilateral Near:   (Pt states that she does not wear contacts or glasses)  Physical Exam Constitutional:      Appearance: Normal appearance.  HENT:     Head: Normocephalic.     Right Ear: Tympanic membrane, ear canal and external ear normal.     Left Ear: Tympanic membrane, ear canal and external ear normal.     Nose: Congestion present. No rhinorrhea.     Mouth/Throat:     Mouth: Mucous membranes are moist.     Pharynx: Oropharynx is clear. No posterior oropharyngeal erythema.  Eyes:     Comments: Mild erythema to the bilateral conjunctiva with periorbital swelling, no drainage noted, vision grossly intact, extraocular movements intact   Cardiovascular:     Rate and Rhythm: Normal rate and regular rhythm.     Pulses: Normal pulses.     Heart sounds: Normal heart sounds.  Pulmonary:     Effort: Pulmonary effort is normal.     Breath sounds: Normal breath sounds.  Skin:  General: Skin is warm and dry.  Neurological:     Mental Status: She is alert and oriented to person, place, and time. Mental status is at baseline.  Psychiatric:        Mood and Affect: Mood normal.        Behavior: Behavior normal.      UC Treatments / Results  Labs (all labs ordered are listed, but only abnormal results are displayed) Labs Reviewed - No data to display  EKG   Radiology No results found.  Procedures Procedures (including critical care time)  Medications Ordered in UC Medications - No data to display  Initial Impression / Assessment and Plan / UC Course  I have reviewed the triage vital signs and the nursing notes.  Pertinent labs & imaging results that were available during my care of the patient were reviewed by me and considered in my medical decision making (see chart for details).  Bacterial conjunctivitis of both eyes, viral URI with cough  Vital signs are stable patient is in no signs of distress, presentation of the eyes is consistent with conjunctivitis, discussed with patient, COVID and strep testing negative, Polytrim prescribed administration, recommended against any eye touching or rubbing to prevent further contamination and irritation, may use oral antihistamines for management of pruritus and over-the-counter analgesics and cool compresses for pain, may use over-the-counter medications for supportive care for remaining symptoms, she given strict precautions that if symptoms persist to follow-up for reevaluation, work note given Final Clinical Impressions(s) / UC Diagnoses   Final diagnoses:  None   Discharge Instructions   None    ED Prescriptions   None    PDMP not reviewed this encounter.    Hans Eden, NP 04/23/22 1136

## 2022-09-08 ENCOUNTER — Emergency Department
Admission: EM | Admit: 2022-09-08 | Discharge: 2022-09-08 | Disposition: A | Discharge status: Home / Self Care | Payer: Self-pay | Attending: Emergency Medicine | Admitting: Emergency Medicine

## 2022-09-08 ENCOUNTER — Other Ambulatory Visit: Payer: Self-pay

## 2022-09-08 ENCOUNTER — Emergency Department: Payer: Self-pay

## 2022-09-08 ENCOUNTER — Encounter: Payer: Self-pay | Admitting: Emergency Medicine

## 2022-09-08 DIAGNOSIS — Y900 Blood alcohol level of less than 20 mg/100 ml: Secondary | ICD-10-CM | POA: Insufficient documentation

## 2022-09-08 DIAGNOSIS — R202 Paresthesia of skin: Secondary | ICD-10-CM

## 2022-09-08 DIAGNOSIS — G459 Transient cerebral ischemic attack, unspecified: Secondary | ICD-10-CM

## 2022-09-08 DIAGNOSIS — R200 Anesthesia of skin: Secondary | ICD-10-CM

## 2022-09-08 HISTORY — DX: Essential (primary) hypertension: I10

## 2022-09-08 LAB — COMPREHENSIVE METABOLIC PANEL
ALT: 18 U/L (ref 0–44)
AST: 23 U/L (ref 15–41)
Albumin: 4.1 g/dL (ref 3.5–5.0)
Alkaline Phosphatase: 66 U/L (ref 38–126)
Anion gap: 9 (ref 5–15)
BUN: 8 mg/dL (ref 6–20)
CO2: 27 mmol/L (ref 22–32)
Calcium: 9.3 mg/dL (ref 8.9–10.3)
Chloride: 103 mmol/L (ref 98–111)
Creatinine, Ser: 0.67 mg/dL (ref 0.44–1.00)
GFR, Estimated: >60 mL/min (ref >60)
Glucose, Bld: 102 mg/dL — ABNORMAL HIGH (ref 70–99)
Potassium: 3.9 mmol/L (ref 3.5–5.1)
Sodium: 139 mmol/L (ref 135–145)
Total Bilirubin: 0.6 mg/dL (ref 0.3–1.2)
Total Protein: 6.9 g/dL (ref 6.5–8.1)

## 2022-09-08 LAB — PROTIME-INR
INR: 1 (ref 0.8–1.2)
Prothrombin Time: 13.1 seconds (ref 11.4–15.2)

## 2022-09-08 LAB — DIFFERENTIAL
Abs Immature Granulocytes: 0.03 10*3/uL (ref 0.00–0.07)
Basophils Absolute: 0.1 10*3/uL (ref 0.0–0.1)
Basophils Relative: 1 %
Eosinophils Absolute: 0.7 10*3/uL — ABNORMAL HIGH (ref 0.0–0.5)
Eosinophils Relative: 6 %
Immature Granulocytes: 0 %
Lymphocytes Relative: 19 %
Lymphs Abs: 2 10*3/uL (ref 0.7–4.0)
Monocytes Absolute: 0.8 10*3/uL (ref 0.1–1.0)
Monocytes Relative: 7 %
Neutro Abs: 7 10*3/uL (ref 1.7–7.7)
Neutrophils Relative %: 67 %

## 2022-09-08 LAB — CBC
HCT: 43.1 % (ref 36.0–46.0)
Hemoglobin: 14.2 g/dL (ref 12.0–15.0)
MCH: 30.4 pg (ref 26.0–34.0)
MCHC: 32.9 g/dL (ref 30.0–36.0)
MCV: 92.3 fL (ref 80.0–100.0)
Platelets: 293 10*3/uL (ref 150–400)
RBC: 4.67 MIL/uL (ref 3.87–5.11)
RDW: 13.6 % (ref 11.5–15.5)
WBC: 10.5 10*3/uL (ref 4.0–10.5)
nRBC: 0 % (ref 0.0–0.2)

## 2022-09-08 LAB — APTT: aPTT: 29 seconds (ref 24–36)

## 2022-09-08 LAB — ETHANOL: Alcohol, Ethyl (B): <10 mg/dL (ref <10)

## 2022-09-08 LAB — CBG MONITORING, ED: Glucose-Capillary: 107 mg/dL — ABNORMAL HIGH (ref 70–99)

## 2022-09-08 MED ORDER — ATORVASTATIN CALCIUM 10 MG PO TABS: 10.0000 mg | ORAL_TABLET | Freq: Every day | ORAL | 0 refills | Status: AC

## 2022-09-08 MED ORDER — SODIUM CHLORIDE 0.9% FLUSH: 3.0000 mL | Freq: Once | INTRAVENOUS | Status: DC

## 2022-09-08 MED ORDER — CLOPIDOGREL BISULFATE 75 MG PO TABS: 75.0000 mg | ORAL_TABLET | Freq: Every day | ORAL | 11 refills | Status: AC

## 2022-09-08 NOTE — ED Provider Notes (Signed)
[facility] Provider Note   Event Date/Time   First MD Initiated Contact with Patient 09/08/22 1139     (approximate) History  Numbness  HPI Michele Kim is a 47 y.o. female with a stated past medical history of TIA 6 months prior to arrival who presents for paresthesias, numbness to the left upper or lower extremity as well as lightheadedness that began approximately 5 AM.  Patient denies any similar symptoms in the past.  Patient states that symptoms have been improving since onset and they are almost back to normal at this time.  Patient denies any exacerbating or relieving symptoms ROS: Patient currently denies any vision changes, tinnitus, difficulty speaking, facial droop, sore throat, chest pain, shortness of breath, abdominal pain, nausea/vomiting/diarrhea, dysuria   Physical Exam  Triage Vital Signs: ED Triage Vitals  Enc Vitals Group     BP 09/08/22 0851 (!) 120/101     Pulse Rate 09/08/22 0851 78     Resp 09/08/22 0851 16     Temp 09/08/22 0851 98.1 F (36.7 C)     Temp Source 09/08/22 0851 Oral     SpO2 09/08/22 0851 99 %     Weight 09/08/22 0848 134 lb 14.7 oz (61.2 kg)     Height 09/08/22 0848 5' (1.524 m)     Head Circumference --      Peak Flow --      Pain Score 09/08/22 0848 0     Pain Loc --      Pain Edu? --      Excl. in [place]? --    Most recent vital signs: Vitals:   09/08/22 1400 09/08/22 1530  BP: 119/75 106/86  Pulse: (!) 55 (!) 59  Resp: 10 18  Temp:  97.8 F (36.6 C)  SpO2: 99% 100%   General: Awake, oriented x4. CV:  Good peripheral perfusion.  Resp:  Normal effort.  Abd:  No distention.  Other:  Middle-aged overweight Caucasian female laying in bed in no acute distress.  NIHSS 0 ED Results / Procedures / Treatments  Labs (all labs ordered are listed, but only abnormal results are displayed) Labs Reviewed  DIFFERENTIAL - Abnormal; Notable for the following components:      Result Value   Eosinophils Absolute  0.7 (*)    All other components within normal limits  COMPREHENSIVE METABOLIC PANEL - Abnormal; Notable for the following components:   Glucose, Bld 102 (*)    All other components within normal limits  CBG MONITORING, ED - Abnormal; Notable for the following components:   Glucose-Capillary 107 (*)    All other components within normal limits  PROTIME-INR  APTT  CBC  ETHANOL  POC URINE PREG, ED   EKG ED ECG REPORT I, Naaman Plummer, the attending physician, personally viewed and interpreted this ECG. Date: 09/08/2022 EKG Time: 0857 Rate: 79 Rhythm: normal sinus rhythm QRS Axis: normal Intervals: normal ST/T Wave abnormalities: normal Narrative Interpretation: no evidence of acute ischemia RADIOLOGY ED MD interpretation: CT of the head without contrast interpreted by me shows no evidence of acute abnormalities including no intracerebral hemorrhage, obvious masses, or significant edema MRI brain without contrast shows small acute to subacute right MCA infarcts -Agree with radiology assessment Official radiology report(s): MR Brain Wo Contrast (neuro protocol)  Result Date: 09/08/2022 CLINICAL DATA:  Neuro deficit, acute, stroke suspected. Left arm paresthesias, headache, and dizziness. EXAM: MRI HEAD WITHOUT CONTRAST TECHNIQUE: Multiplanar, multiecho pulse sequences of the brain and  surrounding structures were obtained without intravenous contrast. COMPARISON:  Head CT 09/08/2022 and MRI 02/25/2022 FINDINGS: Brain: There are multiple small foci of cortical and subcortical mildly restricted diffusion in the right frontal and parietal lobes (MCA territory) compatible with acute to subacute infarcts. Small chronic right parietal and left cerebellar infarcts are unchanged from the prior MRI. T2 hyperintensities in the cerebral white matter bilaterally have slightly progressed from the prior MRI and are nonspecific but compatible with chronic small vessel ischemic disease which is mild but  advanced for age. The ventricles are normal in size. No intracranial hemorrhage, mass, midline shift, or extra-axial fluid collection is identified. Vascular: Major intracranial vascular flow voids are preserved. Skull and upper cervical spine: Unremarkable bone marrow signal. Sinuses/Orbits: Unremarkable orbits. Mild mucosal thickening in the paranasal sinuses. No significant mastoid fluid. Other: None. IMPRESSION: 1. Small acute to subacute right MCA infarcts. 2. Chronic ischemia with old infarcts as above. Electronically Signed   By: Logan Bores M.D.   On: 09/08/2022 14:49   CT HEAD WO CONTRAST  Result Date: 09/08/2022 CLINICAL DATA:  Stroke suspected EXAM: CT HEAD WITHOUT CONTRAST TECHNIQUE: Contiguous axial images were obtained from the base of the skull through the vertex without intravenous contrast. RADIATION DOSE REDUCTION: This exam was performed according to the departmental dose-optimization program which includes automated exposure control, adjustment of the mA and/or kV according to patient size and/or use of iterative reconstruction technique. COMPARISON:  MRI Brain 02/25/22, CT brain 02/25/22 FINDINGS: Brain: No evidence of acute infarction, hemorrhage, hydrocephalus, extra-axial collection or mass lesion/mass effect. Redemonstrated chronic right parietal lobe infarct in the left cerebellar infarct. No CT evidence of a new infarct. Vascular: No hyperdense vessel or unexpected calcification. Skull: Normal. Negative for fracture or focal lesion. Sinuses/Orbits: Mucosal thickening bilateral ethmoid and left sphenoid sinus. The left sphenoethmoidal recesses occluded. Mastoid and middle ears are clear. Other: None. IMPRESSION: 1. No hemorrhage or CT evidence of a new infract. 2. Redemonstrated chronic infarcts in the right parietal lobe and left cerebellum. Electronically Signed   By: Marin Roberts M.D.   On: 09/08/2022 09:24   PROCEDURES: Critical Care performed:  .1-3 Lead EKG  Interpretation  Performed by: Naaman Plummer, MD Authorized by: Naaman Plummer, MD     Interpretation: normal     ECG rate:  61   ECG rate assessment: normal     Rhythm: sinus rhythm     Ectopy: none     Conduction: normal    MEDICATIONS ORDERED IN ED: Medications  sodium chloride flush (NS) 0.9 % injection 3 mL (3 mLs Intravenous Not Given 09/08/22 0900)   IMPRESSION / MDM / ASSESSMENT AND PLAN / ED COURSE  I reviewed the triage vital signs and the nursing notes.                              Patient's presentation is most consistent with acute presentation with potential threat to life or bodily function. Patient is a 47 year old female who presents with symptoms concerning for TIA PMH risk factors: Previous TIA, hypercholesterolemia Neurologic Deficits: Left upper and lower extremity paresthesias Last known Well Time: 0500 NIH Stroke Score: 0 Given History and Exam I have lower suspicion for infectious etiology, neurologic changes secondary to toxicologic ingestion, seizure, complex migraine. Presentation concerning for possible stroke requiring workup.  Workup: Labs: POC glucose, CBC, BMP, LFTs, Troponin, PT/INR, PTT, Type and Screen Other Diagnostics: ECG, CXR, non-contrast head  CT followed by CTA brain and neck (to r/o large vessel occlusion amenable to thrombectomy) Interventions: Patient's not eligible for TPA due to resolution of symptoms prior to evaluation As patient had full workup for similar presentation of TIA 6 months prior to arrival, patient will not benefit from admission at this time given that she has not been continuing her home medications to prevent stroke.  Patient will be placed back on Plavix and statin as well as instructions to continue her aspirin therapy and follow-up with neurology as an outpatient. Disposition: Discharge with neurology follow-up   FINAL CLINICAL IMPRESSION(S) / ED DIAGNOSES   Final diagnoses:  Paresthesia  Numbness  TIA  (transient ischemic attack)   Rx / DC Orders   ED Discharge Orders          Ordered    clopidogrel (PLAVIX) 75 MG tablet  Daily        09/08/22 1550    atorvastatin (LIPITOR) 10 MG tablet  Daily        09/08/22 1550           Note:  This document was prepared using Dragon voice recognition software and may include unintentional dictation errors.   Naaman Plummer, MD 09/08/22 [phone]

## 2022-09-08 NOTE — ED Provider Triage Note (Signed)
Emergency Medicine Provider Triage Evaluation Note  Michele Kim , a 47 y.o. female  was evaluated in triage.  Pt complains of headache, dizziness and left arm feeling like" pins and needles".  + history for TIA and CVA 2023.  Currently not taking BP meds but continues to take ASA daily.    Review of Systems  Positive: See above. Negative: No vomiting  Physical Exam  BP (!) 120/101 (BP Location: Left Arm)   Pulse 78   Temp 98.1 F (36.7 C) (Oral)   Resp 16   Ht 5' (1.524 m)   Wt 61.2 kg   LMP 08/13/2022   SpO2 99%   BMI 26.35 kg/m  Gen:   Awake, no distress   Clear Speech, talkative.  Resp:  Normal effort  MSK:   Moves extremities without difficulty  Other:  Cranial nerves 2-12 grossly intact.   Medical Decision Making  Medically screening exam initiated at 8:53 AM.  Appropriate orders placed.  EADIE REPETTO was informed that the remainder of the evaluation will be completed by another provider, this initial triage assessment does not replace that evaluation, and the importance of remaining in the ED until their evaluation is complete.     Johnn Hai, PA-C 09/08/22 [phone]

## 2022-09-08 NOTE — ED Notes (Signed)
States is prescribed bp meds, but has not taken them on one month due to lack of insurance.

## 2022-09-08 NOTE — ED Triage Notes (Signed)
C/O left arm "Pins and needles", headache and dizziness this morning since 0730.  MAE equally and strong.  Speech clear.

## 2022-09-08 NOTE — ED Notes (Signed)
Pt A&Ox4. Pt experiencing numbness in L hand and arm and L leg. Currently still feeling the same as it was at 0730 to now. Speech is clear. Grip is even. No arm or leg drift. Smile is symmetrical.

## 2022-11-05 DEATH — deceased
